# Patient Record
Sex: Male | Born: 1975 | Race: Black or African American | Hispanic: No | Marital: Single | Smoking: Never smoker
Health system: Southern US, Community
[De-identification: ages and names within clinical notes are randomized; demographics above are authoritative.]

## PROBLEM LIST (undated history)

## (undated) DIAGNOSIS — C20 Malignant neoplasm of rectum: Secondary | ICD-10-CM

## (undated) DIAGNOSIS — K219 Gastro-esophageal reflux disease without esophagitis: Secondary | ICD-10-CM

## (undated) DIAGNOSIS — C7800 Secondary malignant neoplasm of unspecified lung: Secondary | ICD-10-CM

## (undated) DIAGNOSIS — I10 Essential (primary) hypertension: Secondary | ICD-10-CM

---

## 2014-01-30 ENCOUNTER — Inpatient Hospital Stay (HOSPITAL_COMMUNITY): Payer: Self-pay

## 2014-01-30 ENCOUNTER — Emergency Department (HOSPITAL_COMMUNITY): Payer: Self-pay

## 2014-01-30 ENCOUNTER — Emergency Department (INDEPENDENT_AMBULATORY_CARE_PROVIDER_SITE_OTHER)
Admission: EM | Admit: 2014-01-30 | Discharge: 2014-01-30 | Disposition: A | Discharge status: Nursing Home (Includes ICF) | Payer: Self-pay | Source: Home / Self Care | Attending: Family Medicine | Admitting: Family Medicine

## 2014-01-30 ENCOUNTER — Inpatient Hospital Stay (HOSPITAL_COMMUNITY)
Admission: EM | Admit: 2014-01-30 | Discharge: 2014-02-02 | DRG: 375 | Disposition: A | Discharge status: Hospice | Payer: Self-pay | Attending: Internal Medicine | Admitting: Internal Medicine

## 2014-01-30 ENCOUNTER — Encounter (HOSPITAL_COMMUNITY): Payer: Self-pay | Admitting: Emergency Medicine

## 2014-01-30 DIAGNOSIS — R1900 Intra-abdominal and pelvic swelling, mass and lump, unspecified site: Secondary | ICD-10-CM

## 2014-01-30 DIAGNOSIS — R160 Hepatomegaly, not elsewhere classified: Secondary | ICD-10-CM

## 2014-01-30 DIAGNOSIS — C787 Secondary malignant neoplasm of liver and intrahepatic bile duct: Secondary | ICD-10-CM | POA: Diagnosis present

## 2014-01-30 DIAGNOSIS — C20 Malignant neoplasm of rectum: Principal | ICD-10-CM | POA: Diagnosis present

## 2014-01-30 DIAGNOSIS — K5900 Constipation, unspecified: Secondary | ICD-10-CM | POA: Diagnosis present

## 2014-01-30 DIAGNOSIS — R109 Unspecified abdominal pain: Secondary | ICD-10-CM

## 2014-01-30 DIAGNOSIS — K921 Melena: Secondary | ICD-10-CM

## 2014-01-30 DIAGNOSIS — R634 Abnormal weight loss: Secondary | ICD-10-CM | POA: Diagnosis present

## 2014-01-30 DIAGNOSIS — C7800 Secondary malignant neoplasm of unspecified lung: Secondary | ICD-10-CM | POA: Diagnosis present

## 2014-01-30 HISTORY — DX: Secondary malignant neoplasm of unspecified lung: C78.00

## 2014-01-30 HISTORY — PX: LIVER BIOPSY: SHX301

## 2014-01-30 HISTORY — DX: Essential (primary) hypertension: I10

## 2014-01-30 HISTORY — DX: Gastro-esophageal reflux disease without esophagitis: K21.9

## 2014-01-30 HISTORY — DX: Malignant neoplasm of rectum: C20

## 2014-01-30 LAB — URINALYSIS, ROUTINE W REFLEX MICROSCOPIC
Bilirubin Urine: NEGATIVE
Glucose, UA: NEGATIVE mg/dL
Hgb urine dipstick: NEGATIVE
Ketones, ur: NEGATIVE mg/dL
Leukocytes, UA: NEGATIVE
NITRITE: NEGATIVE
PROTEIN: NEGATIVE mg/dL
SPECIFIC GRAVITY, URINE: 1.021 (ref 1.005–1.030)
Urobilinogen, UA: 0.2 mg/dL (ref 0.0–1.0)
pH: 5 (ref 5.0–8.0)

## 2014-01-30 LAB — COMPREHENSIVE METABOLIC PANEL
ALT: 83 U/L — ABNORMAL HIGH (ref 0–53)
AST: 95 U/L — ABNORMAL HIGH (ref 0–37)
Albumin: 3.4 g/dL — ABNORMAL LOW (ref 3.5–5.2)
Alkaline Phosphatase: 313 U/L — ABNORMAL HIGH (ref 39–117)
BUN: 15 mg/dL (ref 6–23)
CO2: 26 mEq/L (ref 19–32)
CREATININE: 1.04 mg/dL (ref 0.50–1.35)
Calcium: 9.4 mg/dL (ref 8.4–10.5)
Chloride: 102 mEq/L (ref 96–112)
Glucose, Bld: 95 mg/dL (ref 70–99)
Potassium: 4 mEq/L (ref 3.7–5.3)
Sodium: 144 mEq/L (ref 137–147)
TOTAL PROTEIN: 7.8 g/dL (ref 6.0–8.3)
Total Bilirubin: 1.5 mg/dL — ABNORMAL HIGH (ref 0.3–1.2)

## 2014-01-30 LAB — POCT URINALYSIS DIP (DEVICE)
Bilirubin Urine: NEGATIVE
Glucose, UA: NEGATIVE mg/dL
Ketones, ur: NEGATIVE mg/dL
Leukocytes, UA: NEGATIVE
NITRITE: NEGATIVE
PH: 5.5 (ref 5.0–8.0)
Protein, ur: 30 mg/dL — AB
Specific Gravity, Urine: 1.015 (ref 1.005–1.030)
Urobilinogen, UA: 0.2 mg/dL (ref 0.0–1.0)

## 2014-01-30 LAB — IRON AND TIBC
IRON: 17 ug/dL — AB (ref 42–135)
Saturation Ratios: 6 % — ABNORMAL LOW (ref 20–55)
TIBC: 273 ug/dL (ref 215–435)
UIBC: 256 ug/dL (ref 125–400)

## 2014-01-30 LAB — POC OCCULT BLOOD, ED: FECAL OCCULT BLD: NEGATIVE

## 2014-01-30 LAB — FERRITIN: FERRITIN: 283 ng/mL (ref 22–322)

## 2014-01-30 LAB — PROTIME-INR
INR: 1.06 (ref 0.00–1.49)
PROTHROMBIN TIME: 13.6 s (ref 11.6–15.2)

## 2014-01-30 LAB — VITAMIN B12: Vitamin B-12: >2000 pg/mL — ABNORMAL HIGH (ref 211–911)

## 2014-01-30 LAB — FOLATE: Folate: 13 ng/mL

## 2014-01-30 LAB — CBC
HCT: 29.3 % — ABNORMAL LOW (ref 39.0–52.0)
HEMOGLOBIN: 9.8 g/dL — AB (ref 13.0–17.0)
MCH: 27.6 pg (ref 26.0–34.0)
MCHC: 33.4 g/dL (ref 30.0–36.0)
MCV: 82.5 fL (ref 78.0–100.0)
Platelets: 305 10*3/uL (ref 150–400)
RBC: 3.55 MIL/uL — ABNORMAL LOW (ref 4.22–5.81)
RDW: 13.1 % (ref 11.5–15.5)
WBC: 6.5 10*3/uL (ref 4.0–10.5)

## 2014-01-30 LAB — ABO/RH: ABO/RH(D): A POS

## 2014-01-30 LAB — RETICULOCYTES
RBC.: 3.35 MIL/uL — ABNORMAL LOW (ref 4.22–5.81)
RETIC COUNT ABSOLUTE: 40.2 10*3/uL (ref 19.0–186.0)
RETIC CT PCT: 1.2 % (ref 0.4–3.1)

## 2014-01-30 LAB — LIPASE, BLOOD: LIPASE: 26 U/L (ref 11–59)

## 2014-01-30 MED ORDER — HYDROCODONE-ACETAMINOPHEN 5-325 MG PO TABS
1.0000 | ORAL_TABLET | ORAL | Status: DC | PRN
  Administered 2014-01-31: 2 via ORAL
  Filled 2014-01-30: qty 2

## 2014-01-30 MED ORDER — FENTANYL CITRATE 0.05 MG/ML IJ SOLN
INTRAMUSCULAR | Status: AC
  Filled 2014-01-30: qty 4

## 2014-01-30 MED ORDER — GUAIFENESIN-DM 100-10 MG/5ML PO SYRP: 5.0000 mL | ORAL_SOLUTION | ORAL | Status: DC | PRN

## 2014-01-30 MED ORDER — POLYETHYLENE GLYCOL 3350 17 G PO PACK
17.0000 g | PACK | Freq: Every day | ORAL | Status: DC | PRN
  Administered 2014-02-01: 17 g via ORAL
  Filled 2014-01-30: qty 1

## 2014-01-30 MED ORDER — FAMOTIDINE 10 MG PO TABS
10.0000 mg | ORAL_TABLET | Freq: Every day | ORAL | Status: DC
  Administered 2014-01-31 – 2014-02-01 (×2): 10 mg via ORAL
  Filled 2014-01-30 (×4): qty 1

## 2014-01-30 MED ORDER — IOHEXOL 300 MG/ML  SOLN
25.0000 mL | INTRAMUSCULAR | Status: AC
  Administered 2014-01-30: 25 mL via ORAL

## 2014-01-30 MED ORDER — MORPHINE SULFATE 4 MG/ML IJ SOLN
4.0000 mg | INTRAMUSCULAR | Status: DC | PRN
  Administered 2014-01-30: 4 mg via INTRAVENOUS
  Filled 2014-01-30: qty 1

## 2014-01-30 MED ORDER — IOHEXOL 300 MG/ML  SOLN
100.0000 mL | Freq: Once | INTRAMUSCULAR | Status: AC | PRN
  Administered 2014-01-30: 100 mL via INTRAVENOUS

## 2014-01-30 MED ORDER — DOCUSATE SODIUM 100 MG PO CAPS
200.0000 mg | ORAL_CAPSULE | Freq: Two times a day (BID) | ORAL | Status: DC
  Administered 2014-01-30 – 2014-02-02 (×6): 200 mg via ORAL
  Filled 2014-01-30 (×6): qty 2

## 2014-01-30 MED ORDER — METOPROLOL TARTRATE 1 MG/ML IV SOLN: 5.0000 mg | INTRAVENOUS | Status: DC | PRN

## 2014-01-30 MED ORDER — MIDAZOLAM HCL 2 MG/2ML IJ SOLN
INTRAMUSCULAR | Status: AC
  Filled 2014-01-30: qty 4

## 2014-01-30 MED ORDER — FENTANYL CITRATE 0.05 MG/ML IJ SOLN
INTRAMUSCULAR | Status: AC | PRN
  Administered 2014-01-30 (×2): 50 ug via INTRAVENOUS

## 2014-01-30 MED ORDER — ONDANSETRON HCL 4 MG PO TABS
4.0000 mg | ORAL_TABLET | Freq: Four times a day (QID) | ORAL | Status: DC | PRN
  Administered 2014-01-31: 4 mg via ORAL
  Filled 2014-01-30: qty 1

## 2014-01-30 MED ORDER — ONDANSETRON HCL 4 MG/2ML IJ SOLN
4.0000 mg | Freq: Four times a day (QID) | INTRAMUSCULAR | Status: DC | PRN
  Administered 2014-01-30: 4 mg via INTRAVENOUS
  Filled 2014-01-30: qty 2

## 2014-01-30 MED ORDER — GELATIN ABSORBABLE 12-7 MM EX MISC
CUTANEOUS | Status: AC
  Filled 2014-01-30: qty 1

## 2014-01-30 MED ORDER — MIDAZOLAM HCL 2 MG/2ML IJ SOLN
INTRAMUSCULAR | Status: AC | PRN
  Administered 2014-01-30: 2 mg via INTRAVENOUS
  Administered 2014-01-30: 1 mg via INTRAVENOUS

## 2014-01-30 NOTE — ED Notes (Signed)
Dr. Walden at bedside 

## 2014-01-30 NOTE — ED Notes (Signed)
Patient returned from CT

## 2014-01-30 NOTE — ED Notes (Signed)
C/o blood in stool x 2 months. Hx of hemorrhoids.  Recently released from prison 2 days ago/10 yr sentence.   States epigastric pain radiates down center of abdomen. Fullness, only able to eat a hand full of food.  Having to push to make urine.  N/v.    No otc meds taken for symptoms.   Denies black / coffee ground stool.

## 2014-01-30 NOTE — ED Notes (Signed)
Patient finished with oral contrast, CT notified

## 2014-01-30 NOTE — H&P (Signed)
Chief Complaint: "Abdominal pain, night sweats, weight loss, blood in my stool." Referring Physician: Dr. Candiss Norse HPI: Andrew Gomez is an 38 y.o. male who presents today c/o RUQ/epigastric abdominal pain x 6 months, bloated, 10-15 lb weight loss in 2 months, night sweats, 1-2 months of dark blood in his stool. He denies any chest pain, shortness of breath or palpitations. He denies any active signs of bleeding or excessive bruising. He admits to fevers and chills. The patient denies any history of sleep apnea or chronic oxygen use. He denies any known complication to sedation. CT performed today reveals infiltrating rectal cancer with perirectal adenopathy, diffuse hepatic metastatic disease and pulmonary metastatic disease. IR received request for image guided liver lesion biopsy. He denies any known family history of cancer.    Past Medical History:  Past Medical History  Diagnosis Date  . Hypertension   . 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 No pertinent past surgical history.  Family History: No family history on file.  Social History:  reports that he has never smoked. He does not have any smokeless tobacco history on file. He reports that he does not drink alcohol or use illicit drugs.  Allergies: No Known Allergies  Medications:   Medication List    ASK your doctor about these medications       dicyclomine 10 MG capsule  Commonly known as:  BENTYL  Take 10 mg by mouth 3 (three) times daily before meals.     docusate sodium 100 MG capsule  Commonly known as:  COLACE  Take 100 mg by mouth 2 (two) times daily.     ranitidine 150 MG tablet  Commonly known as:  ZANTAC  Take 150 mg by mouth 2 (two) times daily.       Please HPI for pertinent positives, otherwise complete 10 system ROS negative.  Physical Exam: BP 144/97  Pulse 106  Temp(Src) 100.2 F (37.9 C) (Oral)  Resp 18  SpO2 100% There is no height or  weight on file to calculate BMI.  General Appearance:  Alert, cooperative, no distress  Head:  Normocephalic, without obvious abnormality, atraumatic  Neck: Supple, symmetrical, trachea midline  Lungs:   Clear to auscultation bilaterally, no w/r/r, respirations unlabored without use of accessory muscles.  Chest Wall:  No tenderness or deformity  Heart:  Regular rate and rhythm, S1, S2 normal, no murmur, rub or gallop.  Abdomen:   Soft, epigastric tenderness, non distended, (+) BS  Extremities: Extremities normal, atraumatic, no cyanosis or edema  Pulses: 2+ and symmetric  Neurologic: Normal affect, no gross deficits.   Results for orders placed during the hospital encounter of 01/30/14 (from the past 48 hour(s))  CBC     Status: Abnormal   Collection Time    01/30/14 10:44 AM      Result Value Ref Range   WBC 6.5  4.0 - 10.5 K/uL   RBC 3.55 (*) 4.22 - 5.81 MIL/uL   Hemoglobin 9.8 (*) 13.0 - 17.0 g/dL   HCT 29.3 (*) 39.0 - 52.0 %   MCV 82.5  78.0 - 100.0 fL   MCH 27.6  26.0 - 34.0 pg   MCHC 33.4  30.0 - 36.0 g/dL   RDW 13.1  11.5 - 15.5 %   Platelets 305  150 - 400 K/uL  COMPREHENSIVE METABOLIC PANEL     Status: Abnormal   Collection Time    01/30/14 10:44 AM  Result Value Ref Range   Sodium 144  137 - 147 mEq/L   Potassium 4.0  3.7 - 5.3 mEq/L   Chloride 102  96 - 112 mEq/L   CO2 26  19 - 32 mEq/L   Glucose, Bld 95  70 - 99 mg/dL   BUN 15  6 - 23 mg/dL   Creatinine, Ser 1.04  0.50 - 1.35 mg/dL   Calcium 9.4  8.4 - 10.5 mg/dL   Total Protein 7.8  6.0 - 8.3 g/dL   Albumin 3.4 (*) 3.5 - 5.2 g/dL   AST 95 (*) 0 - 37 U/L   ALT 83 (*) 0 - 53 U/L   Alkaline Phosphatase 313 (*) 39 - 117 U/L   Total Bilirubin 1.5 (*) 0.3 - 1.2 mg/dL   GFR calc non Af Amer >90  >90 mL/min   GFR calc Af Amer >90  >90 mL/min   Comment: (NOTE)     The eGFR has been calculated using the CKD EPI equation.     This calculation has not been validated in all clinical situations.     eGFR's  persistently <90 mL/min signify possible Chronic Kidney     Disease.  LIPASE, BLOOD     Status: None   Collection Time    01/30/14 10:44 AM      Result Value Ref Range   Lipase 26  11 - 59 U/L  POC OCCULT BLOOD, ED     Status: None   Collection Time    01/30/14 11:03 AM      Result Value Ref Range   Fecal Occult Bld NEGATIVE  NEGATIVE  URINALYSIS, ROUTINE W REFLEX MICROSCOPIC     Status: Abnormal   Collection Time    01/30/14 11:45 AM      Result Value Ref Range   Color, Urine AMBER (*) YELLOW   Comment: BIOCHEMICALS MAY BE AFFECTED BY COLOR   APPearance HAZY (*) CLEAR   Specific Gravity, Urine 1.021  1.005 - 1.030   pH 5.0  5.0 - 8.0   Glucose, UA NEGATIVE  NEGATIVE mg/dL   Hgb urine dipstick NEGATIVE  NEGATIVE   Bilirubin Urine NEGATIVE  NEGATIVE   Ketones, ur NEGATIVE  NEGATIVE mg/dL   Protein, ur NEGATIVE  NEGATIVE mg/dL   Urobilinogen, UA 0.2  0.0 - 1.0 mg/dL   Nitrite NEGATIVE  NEGATIVE   Leukocytes, UA NEGATIVE  NEGATIVE   Comment: MICROSCOPIC NOT DONE ON URINES WITH NEGATIVE PROTEIN, BLOOD, LEUKOCYTES, NITRITE, OR GLUCOSE <1000 mg/dL.  PROTIME-INR     Status: None   Collection Time    01/30/14  1:06 PM      Result Value Ref Range   Prothrombin Time 13.6  11.6 - 15.2 seconds   INR 1.06  0.00 - 1.49   Dg Chest 2 View  01/30/2014   CLINICAL DATA:  Abdominal pain  EXAM: CHEST - 2 VIEW  COMPARISON:  None available  FINDINGS: Lungs are clear. Heart size and mediastinal contours are within normal limits. No effusion. Visualized skeletal structures are unremarkable.  IMPRESSION: No acute cardiopulmonary disease.   Electronically Signed   By: Arne Cleveland M.D.   On: 01/30/2014 11:36   Ct Abdomen Pelvis W Contrast  01/30/2014   CLINICAL DATA:  Blood in stools and diffuse abdominal pain.  EXAM: CT ABDOMEN AND PELVIS WITH CONTRAST  TECHNIQUE: Multidetector CT imaging of the abdomen and pelvis was performed using the standard protocol following bolus administration of intravenous  contrast.  CONTRAST:  152m OMNIPAQUE IOHEXOL 300 MG/ML  SOLN  COMPARISON:  None.  FINDINGS: The lung bases demonstrate small bilateral pulmonary nodules suspicious for metastatic disease. The heart is normal in size. No pericardial effusion.  The liver is markedly enlarged and diffusely infiltrated with tumor. There are head numerous lesions and both lobes of liver consistent with metastatic disease. The largest lesion in the left lobe on image number 12 measures 11 x 10 cm. An indexed lesion in the right lobe on image number 33 measures 7.4 x 6.7 cm. The gallbladder is not well visualized and likely compressed. No common bile duct dilatation. The pancreas appears normal. The spleen is normal in size. No focal lesions. The portal and splenic veins are patent. The adrenal glands and kidneys are unremarkable. There is mild compression of the right kidney by the enlarged right hepatic lobe.  The stomach, duodenum and small bowel are unremarkable. No mass lesions or obstructive findings. The colon demonstrates moderate stool. There is irregular and ill-defined wall thickening involving the upper rectum near the rectosigmoid junction suspicious for infiltrating and likely constricting tumor. There is also significant perirectal interstitial change and adenopathy. No enlarged retroperitoneal lymph nodes and no findings for omental or peritoneal surface disease. No inguinal adenopathy.  The bony structures are unremarkable.  IMPRESSION: Infiltrating rectal cancer with perirectal adenopathy, diffuse hepatic metastatic disease and pulmonary metastatic disease.   Electronically Signed   By: MKalman JewelsM.D.   On: 01/30/2014 12:46    Assessment/Plan Hepatic lesions, request for image guided liver lesion biopsy. CT consistent with infiltrating rectal cancer diffuse hepatic metastatic disease and pulmonary metastatic disease. Melena, fecal occult negative. Patient has been NPO, labs and images reviewed, no blood  thinners. Risks and Benefits discussed with the patient. All of the patient's questions were answered, patient is agreeable to proceed. Consent signed and in chart.   MTsosie BillingD PA-C 01/30/2014, 3:18 PM

## 2014-01-30 NOTE — ED Provider Notes (Signed)
Medical screening examination/treatment/procedure(s) were performed by resident physician or non-physician practitioner and as supervising physician I was immediately available for consultation/collaboration.   Pauline Good MD.   Billy Fischer, MD 01/30/14 1059

## 2014-01-30 NOTE — Procedures (Signed)
Successful LT LIVER MASS CORE BXS WITH ULTRASOUND NO COMP STABLE PATH PENDING FULL REPORT IN PACS

## 2014-01-30 NOTE — ED Notes (Signed)
GI at bedside

## 2014-01-30 NOTE — ED Provider Notes (Signed)
CSN: [id number]     Arrival date & time 01/30/14  3976 History   First MD Initiated Contact with Patient 01/30/14 [phone]     Chief Complaint  Patient presents with  . Rectal Bleeding   (Consider location/radiation/quality/duration/timing/severity/associated sxs/prior Treatment) HPI Comments: 38 year old male, recently released from 45 year prison sentence 2 days ago, presents for evaluation of an ongoing problem for the past 2-3 months. He did not address this in prison because it would have delayed his release. For about 3 months, he has had daily bloody bowel movements.  At times, his BMs are all just a large quantity of blood without any stool at all.  The blood is mixed in with the stool.  For about 2 months, he has had progressively worsening pain in the epigastrum.  This started just below the xyphoid process and the pain has crept down toward the umbilicus over the past 2 months.  He has a painful mass in the upper abdomen that has been growing as well.  He has also had early satiety for the past 2 months.  Additionally, for the past 2 months he has to strain very hard to urinate and his urine is very dark.  For the past 2-3 weeks, he has had fatigue, night sweats, and an unintentional 10 lb weight loss.  He also admits to associated SOB and cough.    Patient is a 38 y.o. male presenting with hematochezia.  Rectal Bleeding Associated symptoms: abdominal pain and fever   Associated symptoms: no dizziness, no light-headedness and no vomiting     History reviewed. No pertinent past medical history. History reviewed. No pertinent past surgical history. History reviewed. No pertinent family history. History  Substance Use Topics  . Smoking status: Never Smoker   . Smokeless tobacco: Not on file  . Alcohol Use: No    Review of Systems  Constitutional: Positive for fever, diaphoresis, activity change, appetite change, fatigue and unexpected weight change. Negative for chills.  HENT: Negative  for sore throat.   Eyes: Negative for visual disturbance.  Respiratory: Positive for cough and shortness of breath.   Cardiovascular: Negative for chest pain, palpitations and leg swelling.  Gastrointestinal: Positive for nausea, abdominal pain, constipation, blood in stool, hematochezia and abdominal distention. Negative for vomiting and diarrhea.  Genitourinary: Positive for difficulty urinating. Negative for dysuria, urgency, frequency, hematuria and decreased urine volume.  Musculoskeletal: Negative for arthralgias, myalgias, neck pain and neck stiffness.  Skin: Negative for rash.  Neurological: Negative for dizziness, weakness and light-headedness.    Allergies  Review of patient's allergies indicates no known allergies.  Home Medications  No current outpatient prescriptions on file. BP 134/92  Pulse 103  Temp(Src) 98.6 F (37 C) (Oral)  Resp 20  SpO2 99% Physical Exam  Nursing note and vitals reviewed. Constitutional: He is oriented to person, place, and time. He appears well-developed and well-nourished. No distress.  Thin habitus   HENT:  Head: Normocephalic.  Cardiovascular: Regular rhythm and normal heart sounds.  Tachycardia present.   Pulmonary/Chest: Effort normal. No respiratory distress.  Abdominal: Normal appearance and bowel sounds are normal. He exhibits no ascites. There is hepatosplenomegaly (significant hepatomegaly which crosses the midline of the abdomen, tender). There is tenderness in the epigastric area. There is no rigidity, no rebound, no guarding, no tenderness at McBurney's point and negative Murphy's sign.  Neurological: He is alert and oriented to person, place, and time. Coordination normal.  Skin: Skin is warm and dry. No rash noted.  He is not diaphoretic.  Psychiatric: He has a normal mood and affect. Judgment normal.    ED Course  Procedures (including critical care time) Labs Review Labs Reviewed - No data to display Imaging Review No  results found.   MDM   1. Abdominal pain   2. Abdominal mass   3. Hepatomegaly   4. Hematochezia    DDx includes inflammatory bowel disease, malignancy, hepatitis, or other chronic infection.  This pt has no insurance, released from prison 2 days ago, limited social support, living in a halfway house, do not feel comfortable discharging him with outpatient followup at this time, I feel his H&P warrants further eval in the ED.  Transferred via [place], PA-C 01/30/14 1030

## 2014-01-30 NOTE — Progress Notes (Signed)
Admitted to room 6n17 from ED, denies pain/nausea at this time,oriented x4, friend at beside

## 2014-01-30 NOTE — ED Provider Notes (Signed)
CSN: [id number]     Arrival date & time 01/30/14  1033 History   First MD Initiated Contact with Patient 01/30/14 1036     No chief complaint on file.    (Consider location/radiation/quality/duration/timing/severity/associated sxs/prior Treatment) Patient is a 38 y.o. male presenting with abdominal pain. The history is provided by the patient.  Abdominal Pain Pain location:  Epigastric and periumbilical Pain quality: aching   Pain radiates to:  Does not radiate Pain severity:  Moderate Onset quality:  Gradual Duration:  2 weeks Timing:  Constant Progression:  Worsening Chronicity:  New Context: not sick contacts and not trauma   Context comment:  Recent incarceration Relieved by:  Nothing Worsened by:  Nothing tried Associated symptoms: constipation and fever (at night)   Associated symptoms: no chest pain, no cough, no shortness of breath and no vomiting     No past medical history on file. No medical history, no surgical history. No past surgical history on file. No family history on file. History  Substance Use Topics  . Smoking status: Never Smoker   . Smokeless tobacco: Not on file  . Alcohol Use: No  No alcohol or illicit drug use.  Review of Systems  Constitutional: Positive for fever (at night), diaphoresis (night sweats), appetite change (decreased, early satiety) and unexpected weight change (10 lbs in past 2 weeks).  Respiratory: Negative for cough and shortness of breath.   Cardiovascular: Negative for chest pain and leg swelling.  Gastrointestinal: Positive for abdominal pain and constipation. Negative for vomiting.  Genitourinary: Positive for difficulty urinating.  All other systems reviewed and are negative.      Allergies  Review of patient's allergies indicates no known allergies.  Home Medications  No current outpatient prescriptions on file. BP 131/90  Pulse 100  Temp(Src) 98 F (36.7 C) (Oral)  Resp 18  SpO2 99% Physical Exam  Nursing  note and vitals reviewed. Constitutional: He is oriented to person, place, and time. He appears well-developed and well-nourished. No distress.  HENT:  Head: Normocephalic and atraumatic.  Mouth/Throat: No oropharyngeal exudate.  Eyes: EOM are normal. Pupils are equal, round, and reactive to light.  Neck: Normal range of motion. Neck supple.  Cardiovascular: Normal rate and regular rhythm.  Exam reveals no friction rub.   No murmur heard. Pulmonary/Chest: Effort normal and breath sounds normal. No respiratory distress. He has no wheezes. He has no rales.  Abdominal: He exhibits distension and mass (hepatomegaly, liver edge roughly 8 cm below costal margin). There is tenderness (central). There is no rebound. Hernia confirmed negative in the right inguinal area and confirmed negative in the left inguinal area.  Genitourinary: Rectal exam shows no external hemorrhoid, no fissure, no mass and anal tone normal. Guaiac negative stool.    Prostate is not enlarged and not tender. Right testis shows no mass, no swelling and no tenderness. Left testis shows no mass, no swelling and no tenderness.  Musculoskeletal: Normal range of motion. He exhibits no edema.  Lymphadenopathy:       Left: No inguinal adenopathy present.  Neurological: He is alert and oriented to person, place, and time.  Skin: He is not diaphoretic.    ED Course  Procedures (including critical care time) Labs Review Labs Reviewed  CBC - Abnormal; Notable for the following:    RBC 3.55 (*)    Hemoglobin 9.8 (*)    HCT 29.3 (*)    All other components within normal limits  COMPREHENSIVE METABOLIC PANEL  LIPASE, BLOOD  URINALYSIS, ROUTINE W REFLEX MICROSCOPIC   Imaging Review Dg Chest 2 View  01/30/2014   CLINICAL DATA:  Abdominal pain  EXAM: CHEST - 2 VIEW  COMPARISON:  None available  FINDINGS: Lungs are clear. Heart size and mediastinal contours are within normal limits. No effusion. Visualized skeletal structures are  unremarkable.  IMPRESSION: No acute cardiopulmonary disease.   Electronically Signed   By: Arne Cleveland M.D.   On: 01/30/2014 11:36   Ct Abdomen Pelvis W Contrast  01/30/2014   CLINICAL DATA:  Blood in stools and diffuse abdominal pain.  EXAM: CT ABDOMEN AND PELVIS WITH CONTRAST  TECHNIQUE: Multidetector CT imaging of the abdomen and pelvis was performed using the standard protocol following bolus administration of intravenous contrast.  CONTRAST:  154mL OMNIPAQUE IOHEXOL 300 MG/ML  SOLN  COMPARISON:  None.  FINDINGS: The lung bases demonstrate small bilateral pulmonary nodules suspicious for metastatic disease. The heart is normal in size. No pericardial effusion.  The liver is markedly enlarged and diffusely infiltrated with tumor. There are head numerous lesions and both lobes of liver consistent with metastatic disease. The largest lesion in the left lobe on image number 12 measures 11 x 10 cm. An indexed lesion in the right lobe on image number 33 measures 7.4 x 6.7 cm. The gallbladder is not well visualized and likely compressed. No common bile duct dilatation. The pancreas appears normal. The spleen is normal in size. No focal lesions. The portal and splenic veins are patent. The adrenal glands and kidneys are unremarkable. There is mild compression of the right kidney by the enlarged right hepatic lobe.  The stomach, duodenum and small bowel are unremarkable. No mass lesions or obstructive findings. The colon demonstrates moderate stool. There is irregular and ill-defined wall thickening involving the upper rectum near the rectosigmoid junction suspicious for infiltrating and likely constricting tumor. There is also significant perirectal interstitial change and adenopathy. No enlarged retroperitoneal lymph nodes and no findings for omental or peritoneal surface disease. No inguinal adenopathy.  The bony structures are unremarkable.  IMPRESSION: Infiltrating rectal cancer with perirectal adenopathy,  diffuse hepatic metastatic disease and pulmonary metastatic disease.   Electronically Signed   By: Kalman Jewels M.D.   On: 01/30/2014 12:46   Ir US Guide Bx Asp/drain  01/30/2014   CLINICAL DATA:  Numerous liver lesions compatible with metastases, rectal mass, presenting metastatic colorectal cancer  EXAM: ULTRASOUND GUIDED CORE BIOPSY OF LEFT HEPATIC MASS  MEDICATIONS: 3 mg IV Versed; 100 mcg IV Fentanyl  Total Moderate Sedation Time: 15 MIN  PROCEDURE: The procedure, risks, benefits, and alternatives were explained to the patient. Questions regarding the procedure were encouraged and answered. The patient understands and consents to the procedure.  The abdominal midline above the umbilicus was prepped with ChloraPrep in a sterile fashion, and a sterile drape was applied covering the operative field. A sterile gown and sterile gloves were used for the procedure. Local anesthesia was provided with 1% Lidocaine.  Previous imaging reviewed. Preliminary ultrasound performed. Several solid large echogenic masses demonstrated throughout the liver correlating with the CT. An anterior midline approach is noted into a left hepatic lesion traversing normal tissue. Under sterile conditions and local anesthesia, a 17 gauge 6.8 cm access needle was advanced from an anterior midline window into a left hepatic lobe echogenic mass. Needle position confirmed with ultrasound. 18 gauge core biopsies obtained. These were placed in formalin. Needle tract embolized with Gel-Foam. Postprocedure imaging demonstrates no hemorrhage or hematoma. Patient tolerated the  biopsy well.  COMPLICATIONS: No immediate  FINDINGS: Imaging confirms needle placement in a large left hepatic echogenic mass  IMPRESSION: Successful ultrasound large left hepatic mass 18 gauge core biopsy   Electronically Signed   By: Daryll Brod M.D.   On: 01/30/2014 16:02     EKG Interpretation None      MDM   Final diagnoses:  Rectal cancer metastasized to  lung  Weight loss, unintentional    38M here from [facility]gent [facility]. Several weeks of worsening symptoms with fever at night, night sweats, early satiety, difficulty in passing stool and urine, bloody stool, abdominal pain. At Urgent Care, found to have hepatomegaly and central abdominal pain. Here, I agree with hepatomegaly. Central abdominal pain. GU exam without hernia, testicular pain. Rectal exam with no external hemorrhoids, no masses felt. Recent incarceration for 10 years, all symptoms began last few weeks while in prison. Will check labs, scan abdomen.  Abdomen with large liver c/w tumors, likely rectal cancer with metastatic spread. Admitted to medicine.  I have reviewed all labs and imaging and considered them in my medical decision making.     Osvaldo Shipper, MD 01/30/14 [phone]

## 2014-01-30 NOTE — H&P (Signed)
Patient Demographics  Andrew Gomez, is a 38 y.o. male  MRN: [id number]   DOB - 13-Jun-1976  Admit Date - 01/30/2014  Outpatient Primary MD for the patient is No PCP Per Patient   With History of -  Past Medical History  Diagnosis Date  . Hypertension   . 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 No pertinent past surgical history.  in for   Chief Complaint  Patient presents with  . Abdominal Pain  . Rectal Bleeding    reported     HPI  Andrew Gomez  is a 38 y.o. male, with no known medical problems, was recently released from prison after 10 years of in consideration presents with about 6 month history of unintentional weight loss of over 15 pounds, night sweats, early satiety , intermittent blood in stool, abdominal bloating, some difficulty in passing urine at times, presented to the ER where his blood work showed mild liver insufficiency, abdominal CT scan was suggestive of metastatic infiltrated rectal cancer with metastases to liver and lungs, his liver is essentially replaced by metastatic disease. I was called to admit the patient.    Review of Systems    In addition to the HPI above,   No Fever-chills, +ve night sweats No Headache, No changes with Vision or hearing, No problems swallowing food or Liquids, No Chest pain, Cough or Shortness of Breath, No Abdominal pain, No Nausea or Vommitting, constipated +ve Blood in stool , none Urine, No dysuria, No new skin rashes or bruises, No new joints pains-aches,  No new weakness, tingling, numbness in any extremity, genralized weakness No recent weight gain , ++ weight loss, No polyuria, polydypsia or polyphagia, No significant Mental Stressors.  A full 10 point Review of Systems was done, except as  stated above, all other Review of Systems were negative.   Social History History  Substance Use Topics  . Smoking status: Never Smoker   . Smokeless tobacco: Not on file  . Alcohol Use: No      Family History Not known  Prior to Admission medications   Medication Sig Start Date End Date Taking? Authorizing Provider  dicyclomine (BENTYL) 10 MG capsule Take 10 mg by mouth 3 (three) times daily before meals.   Yes Historical Provider, MD  docusate sodium (COLACE) 100 MG capsule Take 100 mg by mouth 2 (two) times daily.   Yes Historical Provider, MD  ranitidine (ZANTAC) 150 MG tablet Take 150 mg by mouth 2 (two) times daily.   Yes Historical Provider, MD    No Known Allergies  Physical Exam  Vitals  Blood pressure 124/82, pulse 110, temperature 98 F (36.7 C), temperature source Oral, resp. rate 15, SpO2 100.00%.   1. General young thin AA male lying in bed in NAD,    2. Normal affect and insight, Not Suicidal or Homicidal, Awake Alert, Oriented X 3.  3. No F.N  deficits, ALL C.Nerves Intact, Strength 5/5 all 4 extremities, Sensation intact all 4 extremities, Plantars down going.  4. Ears and Eyes appear Normal, Conjunctivae clear, PERRLA. Moist Oral Mucosa.  5. Supple Neck, No JVD, No cervical lymphadenopathy appriciated, No Carotid Bruits.  6. Symmetrical Chest wall movement, Good air movement bilaterally, CTAB.  7. RRR, No Gallops, Rubs or Murmurs, No Parasternal Heave.  8. Positive Bowel Sounds, Abdomen Soft but distended, palpable liver, Non tender, No organomegaly appriciated,No rebound -guarding or rigidity.  9.  No Cyanosis, Normal Skin Turgor, No Skin Rash or Bruise.  10. Good muscle tone,  joints appear normal , no effusions, Normal ROM.  11. No Palpable Lymph Nodes in Neck or Axillae     Data Review  CBC  Recent Labs Lab 01/30/14 1044  WBC 6.5  HGB 9.8*  HCT 29.3*  PLT 305  MCV 82.5  MCH 27.6  MCHC 33.4  RDW 13.1    ------------------------------------------------------------------------------------------------------------------  Chemistries   Recent Labs Lab 01/30/14 1044  NA 144  K 4.0  CL 102  CO2 26  GLUCOSE 95  BUN 15  CREATININE 1.04  CALCIUM 9.4  AST 95*  ALT 83*  ALKPHOS 313*  BILITOT 1.5*   ------------------------------------------------------------------------------------------------------------------ CrCl is unknown because there is no height on file for the current visit. ------------------------------------------------------------------------------------------------------------------ No results found for this basename: TSH, T4TOTAL, FREET3, T3FREE, THYROIDAB,  in the last 72 hours   Coagulation profile No results found for this basename: INR, PROTIME,  in the last 168 hours ------------------------------------------------------------------------------------------------------------------- No results found for this basename: DDIMER,  in the last 72 hours -------------------------------------------------------------------------------------------------------------------  Cardiac Enzymes No results found for this basename: CK, CKMB, TROPONINI, MYOGLOBIN,  in the last 168 hours ------------------------------------------------------------------------------------------------------------------ No components found with this basename: POCBNP,    ---------------------------------------------------------------------------------------------------------------  Urinalysis    Component Value Date/Time   COLORURINE AMBER* 01/30/2014 1145   APPEARANCEUR HAZY* 01/30/2014 1145   LABSPEC 1.021 01/30/2014 1145   PHURINE 5.0 01/30/2014 1145   GLUCOSEU NEGATIVE 01/30/2014 1145   HGBUR NEGATIVE 01/30/2014 [place] 01/30/2014 [place] 01/30/2014 1145   PROTEINUR NEGATIVE 01/30/2014 1145   UROBILINOGEN 0.2 01/30/2014 1145   NITRITE NEGATIVE 01/30/2014 1145   LEUKOCYTESUR  NEGATIVE 01/30/2014 1145    ----------------------------------------------------------------------------------------------------------------  Imaging results:   Dg Chest 2 View  01/30/2014   CLINICAL DATA:  Abdominal pain  EXAM: CHEST - 2 VIEW  COMPARISON:  None available  FINDINGS: Lungs are clear. Heart size and mediastinal contours are within normal limits. No effusion. Visualized skeletal structures are unremarkable.  IMPRESSION: No acute cardiopulmonary disease.   Electronically Signed   By: Arne Cleveland M.D.   On: 01/30/2014 11:36   Ct Abdomen Pelvis W Contrast  01/30/2014   CLINICAL DATA:  Blood in stools and diffuse abdominal pain.  EXAM: CT ABDOMEN AND PELVIS WITH CONTRAST  TECHNIQUE: Multidetector CT imaging of the abdomen and pelvis was performed using the standard protocol following bolus administration of intravenous contrast.  CONTRAST:  175mL OMNIPAQUE IOHEXOL 300 MG/ML  SOLN  COMPARISON:  None.  FINDINGS: The lung bases demonstrate small bilateral pulmonary nodules suspicious for metastatic disease. The heart is normal in size. No pericardial effusion.  The liver is markedly enlarged and diffusely infiltrated with tumor. There are head numerous lesions and both lobes of liver consistent with metastatic disease. The largest lesion in the left lobe on image number 12 measures 11 x 10 cm. An indexed lesion in the right  lobe on image number 33 measures 7.4 x 6.7 cm. The gallbladder is not well visualized and likely compressed. No common bile duct dilatation. The pancreas appears normal. The spleen is normal in size. No focal lesions. The portal and splenic veins are patent. The adrenal glands and kidneys are unremarkable. There is mild compression of the right kidney by the enlarged right hepatic lobe.  The stomach, duodenum and small bowel are unremarkable. No mass lesions or obstructive findings. The colon demonstrates moderate stool. There is irregular and ill-defined wall thickening  involving the upper rectum near the rectosigmoid junction suspicious for infiltrating and likely constricting tumor. There is also significant perirectal interstitial change and adenopathy. No enlarged retroperitoneal lymph nodes and no findings for omental or peritoneal surface disease. No inguinal adenopathy.  The bony structures are unremarkable.  IMPRESSION: Infiltrating rectal cancer with perirectal adenopathy, diffuse hepatic metastatic disease and pulmonary metastatic disease.   Electronically Signed   By: Kalman Jewels M.D.   On: 01/30/2014 12:46         Assessment & Plan   1. New diagnosis of Infiltrating rectal cancer with metastases to liver and lungs. I have discussed his case both with GI and interventional radiology, he is going for a liver biopsy by interventional radiology later today, will check coags, with the burden of his disease at most he would be a candidate for palliative treatment, once diagnosis is confirmed will involve palliative care and if needed oncology in radiation oncology.    2. Constipation with difficulty in passing stool or urine. We'll place him on stool softeners, bowel regimen, and monitor.     DVT Prophylaxis   SCDs    AM Labs Ordered, also please review Full Orders  Family Communication: Admission, patients condition and plan of care including tests being ordered have been discussed with the patient and wife who indicate understanding and agree with the plan and Code Status.  Code Status Full  Likely DC to  Home/Hospice  Condition GUARDED     Time spent in minutes : 35    SINGH,PRASHANT K M.D on 01/30/2014 at 1:25 PM  Between 7am to 7pm - Pager - [phone]  After 7pm go to [email or web address] - password [email or web address]  And look for the night coverage person covering me after hours  [facility] Office  [phone]

## 2014-01-30 NOTE — Progress Notes (Signed)
CSW spoke with pt and was informed by pt that was diagnosed with cancer. CSW asked if they would like to have a Chaplain to visit. Pt declined. Pt very tearful during conversation. CSW was able to give pt and girlfriend, Olivia Mackie resources for the [employer] application, Social Security Disability and Glass blower/designer. Pt/family was appreciative of services. Pt shared that he will be admitted for further testing. CSW shared that if he needs further assistance ask for CSW on the floor.   [address], [place]

## 2014-01-30 NOTE — ED Notes (Signed)
Patient reports night sweats/fever, blood in stool, hard stools, abdominal pain/distention, bloating, fatigue, decreased appetite and weight loss around 10 lbs the past 2 months, reports increased shortness of breath and dizzy with activity.  States he has increased difficulty urinating and he has to sit and strain to urinate

## 2014-01-30 NOTE — ED Notes (Addendum)
Pt states he feels like he has painful lumps in abdomen for 6 months, feeling bloated, eating habits changed, blood in stool, reports increased fatigue and sob.  Just released from 2 days ago from prison, 10 year sentence.  States that his urine is dark amber and has to sit and strain to urinate.

## 2014-01-31 ENCOUNTER — Inpatient Hospital Stay (HOSPITAL_COMMUNITY): Payer: Self-pay

## 2014-01-31 DIAGNOSIS — K219 Gastro-esophageal reflux disease without esophagitis: Secondary | ICD-10-CM

## 2014-01-31 DIAGNOSIS — R143 Flatulence: Secondary | ICD-10-CM

## 2014-01-31 DIAGNOSIS — C787 Secondary malignant neoplasm of liver and intrahepatic bile duct: Secondary | ICD-10-CM

## 2014-01-31 DIAGNOSIS — R141 Gas pain: Secondary | ICD-10-CM

## 2014-01-31 DIAGNOSIS — R142 Eructation: Secondary | ICD-10-CM

## 2014-01-31 LAB — BASIC METABOLIC PANEL
BUN: 15 mg/dL (ref 6–23)
CHLORIDE: 97 meq/L (ref 96–112)
CO2: 24 mEq/L (ref 19–32)
Calcium: 9.7 mg/dL (ref 8.4–10.5)
Creatinine, Ser: 1.06 mg/dL (ref 0.50–1.35)
GFR calc Af Amer: >90 mL/min (ref >90)
GFR calc non Af Amer: 89 mL/min — ABNORMAL LOW (ref >90)
GLUCOSE: 96 mg/dL (ref 70–99)
POTASSIUM: 4.3 meq/L (ref 3.7–5.3)
Sodium: 140 mEq/L (ref 137–147)

## 2014-01-31 LAB — URINE MICROSCOPIC-ADD ON

## 2014-01-31 LAB — URINALYSIS, ROUTINE W REFLEX MICROSCOPIC
Glucose, UA: NEGATIVE mg/dL
HGB URINE DIPSTICK: NEGATIVE
Ketones, ur: 15 mg/dL — AB
Nitrite: NEGATIVE
PROTEIN: 30 mg/dL — AB
Specific Gravity, Urine: 1.031 — ABNORMAL HIGH (ref 1.005–1.030)
Urobilinogen, UA: 1 mg/dL (ref 0.0–1.0)
pH: 5 (ref 5.0–8.0)

## 2014-01-31 LAB — CBC
HEMATOCRIT: 29.8 % — AB (ref 39.0–52.0)
HEMOGLOBIN: 9.8 g/dL — AB (ref 13.0–17.0)
MCH: 27.2 pg (ref 26.0–34.0)
MCHC: 32.9 g/dL (ref 30.0–36.0)
MCV: 82.8 fL (ref 78.0–100.0)
Platelets: 321 10*3/uL (ref 150–400)
RBC: 3.6 MIL/uL — ABNORMAL LOW (ref 4.22–5.81)
RDW: 13.3 % (ref 11.5–15.5)
WBC: 8.1 10*3/uL (ref 4.0–10.5)

## 2014-01-31 MED ORDER — METOCLOPRAMIDE HCL 5 MG PO TABS
5.0000 mg | ORAL_TABLET | Freq: Three times a day (TID) | ORAL | Status: DC
  Administered 2014-01-31 – 2014-02-02 (×5): 5 mg via ORAL
  Filled 2014-01-31 (×9): qty 1

## 2014-01-31 MED ORDER — PANTOPRAZOLE SODIUM 40 MG PO TBEC
40.0000 mg | DELAYED_RELEASE_TABLET | Freq: Every day | ORAL | Status: DC
  Administered 2014-01-31 – 2014-02-02 (×3): 40 mg via ORAL
  Filled 2014-01-31 (×3): qty 1

## 2014-01-31 MED ORDER — TRAMADOL HCL 50 MG PO TABS
50.0000 mg | ORAL_TABLET | Freq: Four times a day (QID) | ORAL | Status: DC | PRN
  Administered 2014-01-31 – 2014-02-01 (×2): 50 mg via ORAL
  Filled 2014-01-31 (×2): qty 1

## 2014-01-31 MED ORDER — IBUPROFEN 600 MG PO TABS: 600.0000 mg | ORAL_TABLET | Freq: Four times a day (QID) | ORAL | Status: DC | PRN

## 2014-01-31 MED ORDER — ENSURE COMPLETE PO LIQD
237.0000 mL | Freq: Two times a day (BID) | ORAL | Status: DC
  Administered 2014-01-31 – 2014-02-01 (×2): 237 mL via ORAL

## 2014-01-31 MED ORDER — METOPROLOL TARTRATE 25 MG PO TABS
25.0000 mg | ORAL_TABLET | Freq: Two times a day (BID) | ORAL | Status: DC
  Administered 2014-01-31 – 2014-02-01 (×4): 25 mg via ORAL
  Filled 2014-01-31 (×7): qty 1

## 2014-01-31 NOTE — Plan of Care (Signed)
Problem: Phase I Progression Outcomes Goal: Pain controlled with appropriate interventions Outcome: Not Applicable Date Met:  57/90/38 Patient has not voiced any pain today

## 2014-01-31 NOTE — Progress Notes (Signed)
Patient Demographics  Andrew Gomez, is a 38 y.o. male, DOB - 1975-11-21, EXH:[id number]  Admit date - 01/30/2014   Admitting Physician Thurnell Lose, MD  Outpatient Primary MD for the patient is No PCP Per Patient  LOS - 1   Chief Complaint  Patient presents with  . Abdominal Pain  . Rectal Bleeding    reported        Assessment & Plan    1. New diagnosis of Infiltrating rectal cancer with metastases to liver and lungs. I have discussed his case both with GI and interventional radiology, status post CT-guided liver biopsy on 01/31/2014 by IR, GI had nothing to offer, have consulted oncology will see the patient today, prognosis is poor, I offered palliative care to the patient who declined it at this time. Any treatment would be palliative in nature.    2. Constipation with difficulty in passing stool or urine. We'll place him on stool softeners, bowel regimen, had 2 BMs feels better. Passing urine freely now.     3. Monitor temperature. No signs of infection, low-grade temps could be due to underlying malignancy. Will check UA and chest x-ray.      Code Status: Full  Family Communication: Girlfriend bedside  Disposition Plan: Home   Procedures CT-guided liver biopsy on 01/30/2014 by IR, CT abdomen pelvis   Consults  IR, oncology   Medications  Scheduled Meds: . docusate sodium  200 mg Oral BID  . famotidine  10 mg Oral Daily  . metoCLOPramide  5 mg Oral TID AC  . metoprolol tartrate  25 mg Oral BID  . pantoprazole  40 mg Oral Daily   Continuous Infusions:  PRN Meds:.guaiFENesin-dextromethorphan, ibuprofen, metoprolol, ondansetron (ZOFRAN) IV, ondansetron, polyethylene glycol, traMADol  DVT Prophylaxis    SCDs    Lab Results  Component Value Date   PLT 321  01/31/2014    Antibiotics     Anti-infectives   None          Subjective:   Andrew Gomez today has, No headache, No chest pain, No abdominal pain - No Nausea, No new weakness tingling or numbness, No Cough - SOB. Feels better today  Objective:   Filed Vitals:   01/30/14 1835 01/30/14 2240 01/31/14 0442 01/31/14 0552  BP: 135/82 141/90 137/93   Pulse: 111 119 111   Temp: 100 F (37.8 C) 99.9 F (37.7 C) 100.1 F (37.8 C) 99.7 F (37.6 C)  TempSrc: Oral Oral Oral   Resp: 18 18 18    SpO2: 100% 100% 96%     Wt Readings from Last 3 Encounters:  No data found for Wt     Intake/Output Summary (Last 24 hours) at 01/31/14 1206 Last data filed at 01/31/14 0600  Gross per 24 hour  Intake    480 ml  Output      0 ml  Net    480 ml     Physical Exam  Awake Alert, Oriented X 3, No new F.N deficits, Normal affect Merkel.AT,PERRAL Supple Neck,No JVD, No cervical lymphadenopathy appriciated.  Symmetrical Chest wall movement, Good air movement bilaterally, CTAB RRR,No Gallops,Rubs or new Murmurs, No Parasternal Heave +ve B.Sounds, Abd Soft less distended, palpable liver margin, Non tender, No organomegaly appriciated,  No rebound - guarding or rigidity. No Cyanosis, Clubbing or edema, No new Rash or bruise     Data Review   Micro Results No results found for this or any previous visit (from the past 240 hour(s)).  Radiology Reports Dg Chest 2 View  01/30/2014   CLINICAL DATA:  Abdominal pain  EXAM: CHEST - 2 VIEW  COMPARISON:  None available  FINDINGS: Lungs are clear. Heart size and mediastinal contours are within normal limits. No effusion. Visualized skeletal structures are unremarkable.  IMPRESSION: No acute cardiopulmonary disease.   Electronically Signed   By: Arne Cleveland M.D.   On: 01/30/2014 11:36   Ct Abdomen Pelvis W Contrast  01/30/2014   CLINICAL DATA:  Blood in stools and diffuse abdominal pain.  EXAM: CT ABDOMEN AND PELVIS WITH CONTRAST  TECHNIQUE:  Multidetector CT imaging of the abdomen and pelvis was performed using the standard protocol following bolus administration of intravenous contrast.  CONTRAST:  151mL OMNIPAQUE IOHEXOL 300 MG/ML  SOLN  COMPARISON:  None.  FINDINGS: The lung bases demonstrate small bilateral pulmonary nodules suspicious for metastatic disease. The heart is normal in size. No pericardial effusion.  The liver is markedly enlarged and diffusely infiltrated with tumor. There are head numerous lesions and both lobes of liver consistent with metastatic disease. The largest lesion in the left lobe on image number 12 measures 11 x 10 cm. An indexed lesion in the right lobe on image number 33 measures 7.4 x 6.7 cm. The gallbladder is not well visualized and likely compressed. No common bile duct dilatation. The pancreas appears normal. The spleen is normal in size. No focal lesions. The portal and splenic veins are patent. The adrenal glands and kidneys are unremarkable. There is mild compression of the right kidney by the enlarged right hepatic lobe.  The stomach, duodenum and small bowel are unremarkable. No mass lesions or obstructive findings. The colon demonstrates moderate stool. There is irregular and ill-defined wall thickening involving the upper rectum near the rectosigmoid junction suspicious for infiltrating and likely constricting tumor. There is also significant perirectal interstitial change and adenopathy. No enlarged retroperitoneal lymph nodes and no findings for omental or peritoneal surface disease. No inguinal adenopathy.  The bony structures are unremarkable.  IMPRESSION: Infiltrating rectal cancer with perirectal adenopathy, diffuse hepatic metastatic disease and pulmonary metastatic disease.   Electronically Signed   By: Kalman Jewels M.D.   On: 01/30/2014 12:46   Ir US Guide Bx Asp/drain  01/30/2014   CLINICAL DATA:  Numerous liver lesions compatible with metastases, rectal mass, presenting metastatic colorectal  cancer  EXAM: ULTRASOUND GUIDED CORE BIOPSY OF LEFT HEPATIC MASS  MEDICATIONS: 3 mg IV Versed; 100 mcg IV Fentanyl  Total Moderate Sedation Time: 15 MIN  PROCEDURE: The procedure, risks, benefits, and alternatives were explained to the patient. Questions regarding the procedure were encouraged and answered. The patient understands and consents to the procedure.  The abdominal midline above the umbilicus was prepped with ChloraPrep in a sterile fashion, and a sterile drape was applied covering the operative field. A sterile gown and sterile gloves were used for the procedure. Local anesthesia was provided with 1% Lidocaine.  Previous imaging reviewed. Preliminary ultrasound performed. Several solid large echogenic masses demonstrated throughout the liver correlating with the CT. An anterior midline approach is noted into a left hepatic lesion traversing normal tissue. Under sterile conditions and local anesthesia, a 17 gauge 6.8 cm access needle was advanced from an anterior midline window into a left  hepatic lobe echogenic mass. Needle position confirmed with ultrasound. 18 gauge core biopsies obtained. These were placed in formalin. Needle tract embolized with Gel-Foam. Postprocedure imaging demonstrates no hemorrhage or hematoma. Patient tolerated the biopsy well.  COMPLICATIONS: No immediate  FINDINGS: Imaging confirms needle placement in a large left hepatic echogenic mass  IMPRESSION: Successful ultrasound large left hepatic mass 18 gauge core biopsy   Electronically Signed   By: Daryll Brod M.D.   On: 01/30/2014 16:02    CBC  Recent Labs Lab 01/30/14 1044 01/31/14 0418  WBC 6.5 8.1  HGB 9.8* 9.8*  HCT 29.3* 29.8*  PLT 305 321  MCV 82.5 82.8  MCH 27.6 27.2  MCHC 33.4 32.9  RDW 13.1 13.3    Chemistries   Recent Labs Lab 01/30/14 1044 01/31/14 0418  NA 144 140  K 4.0 4.3  CL 102 97  CO2 26 24  GLUCOSE 95 96  BUN 15 15  CREATININE 1.04 1.06  CALCIUM 9.4 9.7  AST 95*  --   ALT  83*  --   ALKPHOS 313*  --   BILITOT 1.5*  --    ------------------------------------------------------------------------------------------------------------------ CrCl is unknown because there is no height on file for the current visit. ------------------------------------------------------------------------------------------------------------------ No results found for this basename: HGBA1C,  in the last 72 hours ------------------------------------------------------------------------------------------------------------------ No results found for this basename: CHOL, HDL, LDLCALC, TRIG, CHOLHDL, LDLDIRECT,  in the last 72 hours ------------------------------------------------------------------------------------------------------------------ No results found for this basename: TSH, T4TOTAL, FREET3, T3FREE, THYROIDAB,  in the last 72 hours ------------------------------------------------------------------------------------------------------------------  Recent Labs  01/30/14 1800  VITAMINB12 >2000*  FOLATE 13.0  FERRITIN 283  TIBC 273  IRON 17*  RETICCTPCT 1.2    Coagulation profile  Recent Labs Lab 01/30/14 1306  INR 1.06    No results found for this basename: DDIMER,  in the last 72 hours  Cardiac Enzymes No results found for this basename: CK, CKMB, TROPONINI, MYOGLOBIN,  in the last 168 hours ------------------------------------------------------------------------------------------------------------------ No components found with this basename: POCBNP,      Time Spent in minutes   35   Tasha Jindra K M.D on 01/31/2014 at 12:06 PM  Between 7am to 7pm - Pager - [phone]  After 7pm go to [email or web address] - password [email or web address]  And look for the night coverage person covering for me after hours  [facility] Office  [phone]

## 2014-01-31 NOTE — Care Management Note (Signed)
CARE MANAGEMENT NOTE 01/31/2014  Patient:  Andrew Gomez, Andrew Gomez   Account Number:  [id number]  Date Initiated:  01/31/2014  Documentation initiated by:  Ricki Miller  Subjective/Objective Assessment:   38 yr old male admitted with night sweats, abdominal pain.     Action/Plan:   Case Manager spoke with patient regarding HCPOA and living will,per order.Patient  given packet.Patient stated he didnt request the information, but would hold onto it.      Anticipated DC Plan:  HOME/SELF CARE      DC Planning Services  CM consult      Choice offered to / List presented to:             Status of service:  Completed, signed off   Discharge Disposition:  [place]

## 2014-01-31 NOTE — Consult Note (Signed)
[place]  Telephone:([phone]) [phone] Fax:(336) [phone]     ID: Andrew Gomez OB: 1976/09/12  MR#: [id number]  GXQ#:[id number]  PCP: No PCP Per Patient GYN:   SU:  OTHER MD:  CHIEF COMPLAINT: "I had bloating and reflux and they found I had cancer."  HISTORY OF PRESENT ILLNESS:  Andrew Gomez tells me he was having bloating and refllux problems for several weeks before developing BRBPR. He brought this to the attention of the institutinal MD at [facility], where the patient was an inmate, and he was treated for hemorrhoids. The patient was recently released to a halfway house here in [place] and he presented to the ED 01/30/2013 with the above complaints. As part of the workup an abd/pelvic CT scan was obtained which showed wall thickening in the upper rectum with perirectal adenopathy, and multiple liver and lung masses consistent with metastatic spread. A liver biopsy was obtained 01/30/2013 with results pending. We were consulted re. Further evaluation and management  REVIEW OF SYSTEMS: The patient has had GERD symptoms, as noted; also some nausea, no vomiting; no taste alteration; no jaundice. Pain is epigastric and minimal. He is having bowel movements, occasionally with blood. Urine stream is good and urine is yellow. No unusual headaches, visual changes or dizzyness. A detailed ROS today was otehrwise noncontributory  PAST MEDICAL HISTORY: Past Medical History  Diagnosis Date  . Hypertension   . 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 GERD (gastroesophageal reflux disease)     PAST SURGICAL HISTORY: Past Surgical History  Procedure Laterality Date  . Liver biopsy  01/30/2014    FAMILY HISTORY History reviewed. No pertinent family history. The patient's mother died age 40 from unclear causes, possibly cancer. She had 10 siblings and per the patient none of them had cancer. The patient's father is living, age 55. The patient has one  brother and one sister. There is no other historyof cancer in the family to his knowledge.  SOCIAL HISTORY:  The patient is single. He has 3 children aged 74, 51 and 8 living with their mothers in 2 separate households in the [place] area. The patient plans to marry Clemetine Marker, who was present at today's visit.     ADVANCED DIRECTIVES: the patient intends to name Ms Felton Clinton as his HCPOA   HEALTH MAINTENANCE: History  Substance Use Topics  . Smoking status: Never Smoker   . Smokeless tobacco: Never Used  . Alcohol Use: No     Colonoscopy:  PSA:  Bone density:  Lipid panel:  No Known Allergies  Current Facility-Administered Medications  Medication Dose Route Frequency Provider Last Rate Last Dose  . docusate sodium (COLACE) capsule 200 mg  200 mg Oral BID Thurnell Lose, MD   200 mg at 01/31/14 0959  . famotidine (PEPCID) tablet 10 mg  10 mg Oral Daily Thurnell Lose, MD   10 mg at 01/31/14 0959  . guaiFENesin-dextromethorphan (ROBITUSSIN DM) 100-10 MG/5ML syrup 5 mL  5 mL Oral Q4H PRN Thurnell Lose, MD      . HYDROcodone-acetaminophen (NORCO/VICODIN) 5-325 MG per tablet 1-2 tablet  1-2 tablet Oral Q4H PRN Thurnell Lose, MD   2 tablet at 01/31/14 0450  . ibuprofen (ADVIL,MOTRIN) tablet 600 mg  600 mg Oral Q6H PRN Thurnell Lose, MD      . metoprolol (LOPRESSOR) injection 5 mg  5 mg Intravenous Q4H PRN Thurnell Lose, MD      . metoprolol tartrate (  LOPRESSOR) tablet 25 mg  25 mg Oral BID Thurnell Lose, MD   25 mg at 01/31/14 0618  . morphine 4 MG/ML injection 4 mg  4 mg Intravenous Q4H PRN Thurnell Lose, MD   4 mg at 01/30/14 2038  . ondansetron (ZOFRAN) tablet 4 mg  4 mg Oral Q6H PRN Thurnell Lose, MD       Or  . ondansetron (ZOFRAN) injection 4 mg  4 mg Intravenous Q6H PRN Thurnell Lose, MD   4 mg at 01/30/14 2038  . polyethylene glycol (MIRALAX / GLYCOLAX) packet 17 g  17 g Oral Daily PRN Thurnell Lose, MD        OBJECTIVE: young African American  man examined sitting at bedside Filed Vitals:   01/31/14 0552  BP:   Pulse:   Temp: 99.7 F (37.6 C)  Resp:      There is no height or weight on file to calculate BMI.    ECOG FS:1 - Symptomatic but completely ambulatory  Ocular: Sclerae unicteric Ear-nose-throat: Oropharynx clear, dentition fair Lymphatic: No cervical or supraclavicular adenopathy Lungs no rales or rhonchi, good excursion bilaterally Heart regular rate and rhythm, no murmur appreciated Abd soft, slightly distended, positive bowel sounds, liver moderately enlarged MSK no focal spinal tenderness, no joint edema Neuro: non-focal, well-oriented, appropriate affect    LAB RESULTS:  CMP     Component Value Date/Time   NA 140 01/31/2014 0418   K 4.3 01/31/2014 0418   CL 97 01/31/2014 0418   CO2 24 01/31/2014 0418   GLUCOSE 96 01/31/2014 0418   BUN 15 01/31/2014 0418   CREATININE 1.06 01/31/2014 0418   CALCIUM 9.7 01/31/2014 0418   PROT 7.8 01/30/2014 1044   ALBUMIN 3.4* 01/30/2014 1044   AST 95* 01/30/2014 1044   ALT 83* 01/30/2014 1044   ALKPHOS 313* 01/30/2014 1044   BILITOT 1.5* 01/30/2014 1044   GFRNONAA 89* 01/31/2014 0418   GFRAA >90 01/31/2014 0418    I No results found for this basename: SPEP, UPEP,  kappa and lambda light chains    Lab Results  Component Value Date   WBC 8.1 01/31/2014   HGB 9.8* 01/31/2014   HCT 29.8* 01/31/2014   MCV 82.8 01/31/2014   PLT 321 01/31/2014    @LASTCHEMISTRY @  No results found for this basename: LABCA2    No components found with this basename: LABCA125     Recent Labs Lab 01/30/14 1306  INR 1.06    Urinalysis    Component Value Date/Time   COLORURINE AMBER* 01/30/2014 1145   APPEARANCEUR HAZY* 01/30/2014 1145   LABSPEC 1.021 01/30/2014 1145   PHURINE 5.0 01/30/2014 1145   GLUCOSEU NEGATIVE 01/30/2014 1145   HGBUR NEGATIVE 01/30/2014 1145   BILIRUBINUR NEGATIVE 01/30/2014 1145   KETONESUR NEGATIVE 01/30/2014 1145   PROTEINUR NEGATIVE 01/30/2014 1145   UROBILINOGEN 0.2 01/30/2014 1145    NITRITE NEGATIVE 01/30/2014 1145   LEUKOCYTESUR NEGATIVE 01/30/2014 1145    STUDIES: Dg Chest 2 View  01/30/2014   CLINICAL DATA:  Abdominal pain  EXAM: CHEST - 2 VIEW  COMPARISON:  None available  FINDINGS: Lungs are clear. Heart size and mediastinal contours are within normal limits. No effusion. Visualized skeletal structures are unremarkable.  IMPRESSION: No acute cardiopulmonary disease.   Electronically Signed   By: Arne Cleveland M.D.   On: 01/30/2014 11:36   Ct Abdomen Pelvis W Contrast  01/30/2014   CLINICAL DATA:  Blood in stools and diffuse abdominal  pain.  EXAM: CT ABDOMEN AND PELVIS WITH CONTRAST  TECHNIQUE: Multidetector CT imaging of the abdomen and pelvis was performed using the standard protocol following bolus administration of intravenous contrast.  CONTRAST:  185mL OMNIPAQUE IOHEXOL 300 MG/ML  SOLN  COMPARISON:  None.  FINDINGS: The lung bases demonstrate small bilateral pulmonary nodules suspicious for metastatic disease. The heart is normal in size. No pericardial effusion.  The liver is markedly enlarged and diffusely infiltrated with tumor. There are head numerous lesions and both lobes of liver consistent with metastatic disease. The largest lesion in the left lobe on image number 12 measures 11 x 10 cm. An indexed lesion in the right lobe on image number 33 measures 7.4 x 6.7 cm. The gallbladder is not well visualized and likely compressed. No common bile duct dilatation. The pancreas appears normal. The spleen is normal in size. No focal lesions. The portal and splenic veins are patent. The adrenal glands and kidneys are unremarkable. There is mild compression of the right kidney by the enlarged right hepatic lobe.  The stomach, duodenum and small bowel are unremarkable. No mass lesions or obstructive findings. The colon demonstrates moderate stool. There is irregular and ill-defined wall thickening involving the upper rectum near the rectosigmoid junction suspicious for infiltrating  and likely constricting tumor. There is also significant perirectal interstitial change and adenopathy. No enlarged retroperitoneal lymph nodes and no findings for omental or peritoneal surface disease. No inguinal adenopathy.  The bony structures are unremarkable.  IMPRESSION: Infiltrating rectal cancer with perirectal adenopathy, diffuse hepatic metastatic disease and pulmonary metastatic disease.   Electronically Signed   By: Kalman Jewels M.D.   On: 01/30/2014 12:46   Ir US Guide Bx Asp/drain  01/30/2014   CLINICAL DATA:  Numerous liver lesions compatible with metastases, rectal mass, presenting metastatic colorectal cancer  EXAM: ULTRASOUND GUIDED CORE BIOPSY OF LEFT HEPATIC MASS  MEDICATIONS: 3 mg IV Versed; 100 mcg IV Fentanyl  Total Moderate Sedation Time: 15 MIN  PROCEDURE: The procedure, risks, benefits, and alternatives were explained to the patient. Questions regarding the procedure were encouraged and answered. The patient understands and consents to the procedure.  The abdominal midline above the umbilicus was prepped with ChloraPrep in a sterile fashion, and a sterile drape was applied covering the operative field. A sterile gown and sterile gloves were used for the procedure. Local anesthesia was provided with 1% Lidocaine.  Previous imaging reviewed. Preliminary ultrasound performed. Several solid large echogenic masses demonstrated throughout the liver correlating with the CT. An anterior midline approach is noted into a left hepatic lesion traversing normal tissue. Under sterile conditions and local anesthesia, a 17 gauge 6.8 cm access needle was advanced from an anterior midline window into a left hepatic lobe echogenic mass. Needle position confirmed with ultrasound. 18 gauge core biopsies obtained. These were placed in formalin. Needle tract embolized with Gel-Foam. Postprocedure imaging demonstrates no hemorrhage or hematoma. Patient tolerated the biopsy well.  COMPLICATIONS: No immediate   FINDINGS: Imaging confirms needle placement in a large left hepatic echogenic mass  IMPRESSION: Successful ultrasound large left hepatic mass 18 gauge core biopsy   Electronically Signed   By: Daryll Brod M.D.   On: 01/30/2014 16:02    ASSESSMENT: 38 y.o. [place] man s/p liver biopsy 01/30/2013 for what by scans appears most consistent with stage IV rectal cancer, with involvement of the liver and lungs  PLAN: We spent the better part of today's hour-long appointment discussing the biology of rectal cancer in  general, and the specifics of the patient's tumor in particular. Mr. Kasim understands stage IV rectal cancer is unfortunately not curable with our currnet knowledge base. It is very treatable, and treatment generally prolongs life, improves symptoms, and in his case may prevent bowel obstruction. We specifically discussed FOLFOX chemotherapy which would be my recommendation if the patient does have rectal adenocarcinoma, as we belie is the case.  Mr Moskal's social situation is complex. It may be possible for him to be released from his [place] halfway house to live with his fiancee, Clemetine Marker in [place] (near [place]). In that case most or all of his treatment would take place there. The patient has asked me to contact the Case manager at his halfway house, [place], [phone], to start the process and I will do that today.  It would be helpful if the patient could complete a HCPOA document and have it notarized prior to discharge-- will place a SW consult to facilitate this.  I would avoid narcotics for pain, using tramadol and naproxen instead. Would discharge on metoclopramide 5 mg po TID AC, stool softeners 1 po BID and omeprazole 40 mg po daily.  I will follow Mr Rammel as an outpatient if he stays in [place], and arrange for referral if he moves to [place] may discharge him at your discretion.   Chauncey Cruel, MD   01/31/2014 11:29 AM

## 2014-01-31 NOTE — Progress Notes (Signed)
INITIAL NUTRITION ASSESSMENT  DOCUMENTATION CODES Per approved criteria  -Not Applicable   INTERVENTION:  Please obtain current height and weight Ensure Complete po BID, each supplement provides 350 kcal and 13 grams of protein RD to follow for nutrition care plan  NUTRITION DIAGNOSIS: Increased nutrient needs related to catabolic illness as evidenced by estimated nutrition needs  Goal: Pt to meet >/= 90% of their estimated nutrition needs   Monitor:  PO & supplemental intake, weight, labs, I/O's  Reason for Assessment: Malnutrition Screening Tool Report  38 y.o. male  Admitting Dx: Rectal cancer metastasized to lung  ASSESSMENT: 38 y.o. male who presents today c/o RUQ/epigastric abdominal pain x 6 months, bloated, 10-15 lb weight loss in 2 months, night sweats, 1-2 months of dark blood in his stool. CT performed revealed infiltrating rectal cancer with perirectal adenopathy, diffuse hepatic metastatic disease and pulmonary metastatic disease.   RD briefly spoke with pt prior to MD's arrival for consultation; pt stated his appetite was "lousy;" 2nd attempt pt was preparing for X-ray; per H&P pt has had a 10-15 weight loss in the past 2 months; PO intake at 50% per flowsheet records; would benefit from addition of oral nutrition supplements -- will order.  RD suspects some level of malnutrition, however, unable to identify at this time.  Height: Ht Readings from Last 1 Encounters:  No data found for Ht    Weight: Wt Readings from Last 1 Encounters:  No data found for Wt    Ideal Body Weight: unable to assess  % Ideal Body Weight: unable to assess  Wt Readings from Last 10 Encounters:  No data found for Wt    Usual Body Weight: unable to assess  % Usual Body Weight: unable to assess  BMI:  There is no height or weight on file to calculate.  Estimated Nutritional Needs: Kcal: > Protein: > unable to assess Fluid: >  Skin: Intact  Diet Order:  General  EDUCATION NEEDS: -No education needs identified at this time   Intake/Output Summary (Last 24 hours) at 01/31/14 1454 Last data filed at 01/31/14 0600  Gross per 24 hour  Intake    480 ml  Output      0 ml  Net    480 ml   Labs:   Recent Labs Lab 01/30/14 1044 01/31/14 0418  NA 144 140  K 4.0 4.3  CL 102 97  CO2 26 24  BUN 15 15  CREATININE 1.04 1.06  CALCIUM 9.4 9.7  GLUCOSE 95 96    Scheduled Meds: . docusate sodium  200 mg Oral BID  . famotidine  10 mg Oral Daily  . metoCLOPramide  5 mg Oral TID AC  . metoprolol tartrate  25 mg Oral BID  . pantoprazole  40 mg Oral Daily    Continuous Infusions:   Past Medical History  Diagnosis Date  . Hypertension   . 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 GERD (gastroesophageal reflux disease)     Past Surgical History  Procedure Laterality Date  . Liver biopsy  01/30/2014    Arthur Holms, RD, LDN Pager #: [phone] After-Hours Pager #: [phone]

## 2014-02-01 ENCOUNTER — Other Ambulatory Visit: Payer: Self-pay | Admitting: Oncology

## 2014-02-01 DIAGNOSIS — K5900 Constipation, unspecified: Secondary | ICD-10-CM | POA: Diagnosis present

## 2014-02-01 LAB — URINE CULTURE: Colony Count: 3000

## 2014-02-01 MED ORDER — BISACODYL 10 MG RE SUPP
10.0000 mg | Freq: Every day | RECTAL | Status: DC
  Administered 2014-02-01 – 2014-02-02 (×2): 10 mg via RECTAL
  Filled 2014-02-01 (×2): qty 1

## 2014-02-01 NOTE — Progress Notes (Addendum)
[facility] PROGRESS NOTE  Andrew Gomez IPJ:[id number] DOB: 08-15-1976 DOA: 01/30/2014 PCP: No PCP Per Patient  Brief narrative 38 y.o. male, with no known medical problems, was recently released from prison after 10 years into halfway house for reintegration presented with about 6 month history of unintentional weight loss of over 15 pounds, night sweats, early satiety , intermittent blood in stool, abdominal bloating, some difficulty in passing urine at times, presented to the ER where his blood work showed mild liver insufficiency, abdominal CT scan was suggestive of metastatic infiltrated rectal cancer with metastases to liver and lungs.  Assessment/Plan:  Stage IV rectal cancer with metastases to liver and lungs.  status post CT-guided liver biopsy on 01/31/2014 by IR, No further recommendation for GI. Seen by oncology Dr Jana Hakim .  He needs to complete a HCPOA document and have it notarized prior to discharge.  Dr Jana Hakim discussed with pt regarfing options of chemotherapy that can prolong life. He agrees to go ahead with chemo if biopsy confirms  malignancy.  Will inform Ms Brooks Sailors at halfway house about treatment plan as outpt. Avoid narcotics for pain control. On naproxen and tramadol. Added reglan and PPI.   Constipation  ON colace 200 mg bid and miralax daily. Has not had BM for past 2 days. Will order dulcolax suppository.    Diet : regular   Code Status: full Family Communication: none at bedside. Contact Tressia Miners ( friend) [phone] Disposition Plan: return to halfway home possibly in am   Consultants:  Dr Jana Hakim  Procedures:  none  Antibiotics:  none  HPI/Subjective: Patient seen and examined . Reports RLQ pain. He wants to proceed to with chemotherapy when appropriate.   Objective: Filed Vitals:   02/01/14 0955  BP: 127/81  Pulse: 115  Temp:   Resp: 20   No intake or output data in the 24 hours ending 02/01/14 1434 There were no vitals  filed for this visit.  Exam:   General:  Middle aged male in NAD  HEENT: no pallor, moist mucosa   chest:c lear b/l, no added sounds   CVS: N S1&S2, no murmurs, rubs gallop   abd: soft, ND, mild tenderness over RLQ, BS+  Ext: warm, no edema  CNS: AAOX3  Data Reviewed: Basic Metabolic Panel:  Recent Labs Lab 01/30/14 1044 01/31/14 0418  NA 144 140  K 4.0 4.3  CL 102 97  CO2 26 24  GLUCOSE 95 96  BUN 15 15  CREATININE 1.04 1.06  CALCIUM 9.4 9.7   Liver Function Tests:  Recent Labs Lab 01/30/14 1044  AST 95*  ALT 83*  ALKPHOS 313*  BILITOT 1.5*  PROT 7.8  ALBUMIN 3.4*    Recent Labs Lab 01/30/14 1044  LIPASE 26   No results found for this basename: AMMONIA,  in the last 168 hours CBC:  Recent Labs Lab 01/30/14 1044 01/31/14 0418  WBC 6.5 8.1  HGB 9.8* 9.8*  HCT 29.3* 29.8*  MCV 82.5 82.8  PLT 305 321   Cardiac Enzymes: No results found for this basename: CKTOTAL, CKMB, CKMBINDEX, TROPONINI,  in the last 168 hours BNP (last 3 results) No results found for this basename: PROBNP,  in the last 8760 hours CBG: No results found for this basename: GLUCAP,  in the last 168 hours  No results found for this or any previous visit (from the past 240 hour(s)).   Studies: Dg Chest 2 View  01/31/2014   CLINICAL DATA:  Hypertension, history of rectal carcinoma  EXAM: CHEST  2 VIEW  COMPARISON:  01/30/2014  FINDINGS: Cardiac shadow is within normal limits. The lungs are well aerated bilaterally. The patient's known no pulmonary nodules are not well appreciated on this exam due to a size. No bony abnormality is seen.  IMPRESSION: No active cardiopulmonary disease.   Electronically Signed   By: Inez Catalina M.D.   On: 01/31/2014 15:41   Ir US Guide Bx Asp/drain  01/30/2014   CLINICAL DATA:  Numerous liver lesions compatible with metastases, rectal mass, presenting metastatic colorectal cancer  EXAM: ULTRASOUND GUIDED CORE BIOPSY OF LEFT HEPATIC MASS   MEDICATIONS: 3 mg IV Versed; 100 mcg IV Fentanyl  Total Moderate Sedation Time: 15 MIN  PROCEDURE: The procedure, risks, benefits, and alternatives were explained to the patient. Questions regarding the procedure were encouraged and answered. The patient understands and consents to the procedure.  The abdominal midline above the umbilicus was prepped with ChloraPrep in a sterile fashion, and a sterile drape was applied covering the operative field. A sterile gown and sterile gloves were used for the procedure. Local anesthesia was provided with 1% Lidocaine.  Previous imaging reviewed. Preliminary ultrasound performed. Several solid large echogenic masses demonstrated throughout the liver correlating with the CT. An anterior midline approach is noted into a left hepatic lesion traversing normal tissue. Under sterile conditions and local anesthesia, a 17 gauge 6.8 cm access needle was advanced from an anterior midline window into a left hepatic lobe echogenic mass. Needle position confirmed with ultrasound. 18 gauge core biopsies obtained. These were placed in formalin. Needle tract embolized with Gel-Foam. Postprocedure imaging demonstrates no hemorrhage or hematoma. Patient tolerated the biopsy well.  COMPLICATIONS: No immediate  FINDINGS: Imaging confirms needle placement in a large left hepatic echogenic mass  IMPRESSION: Successful ultrasound large left hepatic mass 18 gauge core biopsy   Electronically Signed   By: Daryll Brod M.D.   On: 01/30/2014 16:02    Scheduled Meds: . docusate sodium  200 mg Oral BID  . famotidine  10 mg Oral Daily  . feeding supplement (ENSURE COMPLETE)  237 mL Oral BID BM  . metoCLOPramide  5 mg Oral TID AC  . metoprolol tartrate  25 mg Oral BID  . pantoprazole  40 mg Oral Daily   Continuous Infusions:     Time spent: 25 minutes    Pa Tennant  [facility] Pager [phone]. If 7PM-7AM, please contact night-coverage at [email or web address], password  [facility] 02/01/2014, 2:34 PM  LOS: 2 days

## 2014-02-02 MED ORDER — TRAMADOL HCL 50 MG PO TABS: 50.0000 mg | ORAL_TABLET | Freq: Four times a day (QID) | ORAL | Status: AC | PRN

## 2014-02-02 MED ORDER — IBUPROFEN 600 MG PO TABS: 600.0000 mg | ORAL_TABLET | Freq: Four times a day (QID) | ORAL | Status: DC | PRN

## 2014-02-02 MED ORDER — POLYETHYLENE GLYCOL 3350 17 G PO PACK: 17.0000 g | PACK | Freq: Every day | ORAL | Status: AC

## 2014-02-02 MED ORDER — METOCLOPRAMIDE HCL 5 MG PO TABS: 5.0000 mg | ORAL_TABLET | Freq: Three times a day (TID) | ORAL | Status: DC

## 2014-02-02 MED ORDER — PANTOPRAZOLE SODIUM 40 MG PO TBEC: 40.0000 mg | DELAYED_RELEASE_TABLET | Freq: Every day | ORAL | Status: AC

## 2014-02-02 MED ORDER — ENSURE COMPLETE PO LIQD: 237.0000 mL | Freq: Two times a day (BID) | ORAL | Status: DC

## 2014-02-02 NOTE — Clinical Social Work Note (Signed)
CSW received consult for assistance in advance directives. CSW consulted with [employer] regarding assistance advance directives as pt is to be discharged today (per Therapist, sports). [facility] to follow-up with pt regarding advance directives. CSW signing off.  Pati Gallo, [place] Social Worker [phone]

## 2014-02-02 NOTE — Discharge Summary (Signed)
Discharge education complete.  Current medications reviewed. Went over prescription with patient. Went over infection prevention measures. Community resources reviewed with patients. Mychart reviewed with patient.

## 2014-02-02 NOTE — Discharge Summary (Addendum)
Physician Discharge Summary  Andrew Gomez XKG:[id number] DOB: [birth year] DOA: 01/30/2014  PCP: No PCP Per Patient  Admit date: 01/30/2014 Discharge date: 02/02/2014  Time spent: 40 minutes  Recommendations for Outpatient Follow-up:  1. Discharge to halfway house with outpt follow up with Dr Andrew Gomez in 1 -2 weeks. 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 male, with no known medical problems, was recently released from prison after 10 years into halfway house for reintegration presented with about 6 month history of unintentional weight loss of over 15 pounds, night sweats, early satiety , intermittent blood in stool, abdominal bloating, some difficulty in passing urine at times, presented to the ER where his blood work showed mild liver insufficiency, abdominal CT scan was suggestive of metastatic infiltrated rectal cancer with metastases to liver and lungs.   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  status post CT-guided liver biopsy on 01/31/2014 by IR, biopsy results pending No further recommendation per  GI. Seen by oncology Dr Andrew Gomez . Dr Andrew Gomez discussed with pt regarfing options of chemotherapy that can prolong life. He agrees to go ahead with chemo if biopsy confirms malignancy.  He needs to complete a HCPOA document and have it notarized prior to discharge. Seen by chaplain who instructed him on filling out the form. - informed Ms Andrew Gomez at halfway house about treatment plan as outpt and providing flexibility t to patient in regards to his  treatment. Avoid narcotics for pain control. On naproxen and tramadol. Added reglan and PPI.   Constipation  Discharge on colace 200 mg bid and miralax daily.    Diet : regular   Code Status: full   Family Communication: none at bedside.  Disposition Plan: return to halfway home  Consultants:  Dr Andrew Gomez   Procedures:  None   Antibiotics:  none   Discharge Exam: Filed Vitals:   02/02/14 0508  BP: 125/72  Pulse: 109  Temp: 99.4 F (37.4 C)  Resp: 18    General: Middle aged male in NAD  HEENT: no pallor, moist mucosa  chest:c lear b/l, no added sounds  CVS: N S1&S2, no murmurs, rubs gallop  abd: soft, ND, mild tenderness over RLQ, BS+  Ext: warm, no edema  CNS: AAOX3   Discharge Instructions You were cared for by a hospitalist during your hospital stay. If you have any questions about your discharge medications or the care you received while you were in the hospital after you are discharged, you can call the unit and asked to speak with the hospitalist on call if the hospitalist that took care of you is not available. Once you are discharged, your primary care physician will handle any further medical issues. Please note that NO REFILLS for any discharge medications will be authorized once you are discharged, as it is imperative that you return to your primary care physician (or establish a relationship with a primary care physician if you do not have one) for your aftercare needs so that they can reassess your need for medications and monitor your lab values.  Medication List    STOP taking these medications       ranitidine 150 MG tablet  Commonly known as:  ZANTAC      TAKE these medications       dicyclomine 10 MG capsule  Commonly known as:  BENTYL  Take 10 mg by mouth 3 (three) times daily before meals.     docusate sodium 100 MG capsule  Commonly known as:  COLACE  Take 100 mg by mouth 2 (two) times daily.     feeding supplement (ENSURE  COMPLETE) Liqd  Take 237 mLs by mouth 2 (two) times daily between meals.     ibuprofen 600 MG tablet  Commonly known as:  ADVIL,MOTRIN  Take 1 tablet (600 mg total) by mouth every 6 (six) hours as needed for headache.     metoCLOPramide 5 MG tablet  Commonly known as:  REGLAN  Take 1 tablet (5 mg total) by mouth 3 (three) times daily before meals.     pantoprazole 40 MG tablet  Commonly known as:  PROTONIX  Take 1 tablet (40 mg total) by mouth daily.     polyethylene glycol packet  Commonly known as:  MIRALAX / GLYCOLAX  Take 17 g by mouth daily.     traMADol 50 MG tablet  Commonly known as:  ULTRAM  Take 1 tablet (50 mg total) by mouth every 6 (six) hours as needed for moderate pain.       No Known Allergies    The results of significant diagnostics from this hospitalization (including imaging, microbiology, ancillary and laboratory) are listed below for reference.    Significant Diagnostic Studies: Dg Chest 2 View  01/31/2014   CLINICAL DATA:  Hypertension, history of rectal carcinoma  EXAM: CHEST  2 VIEW  COMPARISON:  01/30/2014  FINDINGS: Cardiac shadow is within normal limits. The lungs are well aerated bilaterally. The patient's known no pulmonary nodules are not well appreciated on this exam due to a size. No bony abnormality is seen.  IMPRESSION: No active cardiopulmonary disease.   Electronically Signed   By: Andrew Gomez M.D.   On: 01/31/2014 15:41   Dg Chest 2 View  01/30/2014   CLINICAL DATA:  Abdominal pain  EXAM: CHEST - 2 VIEW  COMPARISON:  None available  FINDINGS: Lungs are clear. Heart size and mediastinal contours are within normal limits. No effusion. Visualized skeletal structures are unremarkable.  IMPRESSION: No acute cardiopulmonary disease.   Electronically Signed   By: Andrew Gomez M.D.   On: 01/30/2014 11:36   Ct Abdomen Pelvis W Contrast  01/30/2014   CLINICAL DATA:  Blood in stools and diffuse abdominal pain.  EXAM: CT ABDOMEN AND PELVIS WITH  CONTRAST  TECHNIQUE: Multidetector CT imaging of the abdomen and pelvis was performed using the standard protocol following bolus administration of intravenous contrast.  CONTRAST:  141mL OMNIPAQUE IOHEXOL 300 MG/ML  SOLN  COMPARISON:  None.  FINDINGS: The lung bases demonstrate small bilateral pulmonary nodules suspicious for metastatic disease. The heart is normal in size. No pericardial effusion.  The liver is markedly enlarged and diffusely infiltrated with tumor. There are head numerous lesions and both lobes of liver consistent with metastatic disease. The largest lesion in the left lobe on image number 12 measures 11 x 10 cm. An indexed lesion in the right lobe on image number 33 measures 7.4 x 6.7 cm. The gallbladder is not well visualized and likely compressed. No common bile duct dilatation. The pancreas appears normal.  The spleen is normal in size. No focal lesions. The portal and splenic veins are patent. The adrenal glands and kidneys are unremarkable. There is mild compression of the right kidney by the enlarged right hepatic lobe.  The stomach, duodenum and small bowel are unremarkable. No mass lesions or obstructive findings. The colon demonstrates moderate stool. There is irregular and ill-defined wall thickening involving the upper rectum near the rectosigmoid junction suspicious for infiltrating and likely constricting tumor. There is also significant perirectal interstitial change and adenopathy. No enlarged retroperitoneal lymph nodes and no findings for omental or peritoneal surface disease. No inguinal adenopathy.  The bony structures are unremarkable.  IMPRESSION: Infiltrating rectal cancer with perirectal adenopathy, diffuse hepatic metastatic disease and pulmonary metastatic disease.   Electronically Signed   By: Kalman Jewels M.D.   On: 01/30/2014 12:46   Ir US Guide Bx Asp/drain  01/30/2014   CLINICAL DATA:  Numerous liver lesions compatible with metastases, rectal mass, presenting  metastatic colorectal cancer  EXAM: ULTRASOUND GUIDED CORE BIOPSY OF LEFT HEPATIC MASS  MEDICATIONS: 3 mg IV Versed; 100 mcg IV Fentanyl  Total Moderate Sedation Time: 15 MIN  PROCEDURE: The procedure, risks, benefits, and alternatives were explained to the patient. Questions regarding the procedure were encouraged and answered. The patient understands and consents to the procedure.  The abdominal midline above the umbilicus was prepped with ChloraPrep in a sterile fashion, and a sterile drape was applied covering the operative field. A sterile gown and sterile gloves were used for the procedure. Local anesthesia was provided with 1% Lidocaine.  Previous imaging reviewed. Preliminary ultrasound performed. Several solid large echogenic masses demonstrated throughout the liver correlating with the CT. An anterior midline approach is noted into a left hepatic lesion traversing normal tissue. Under sterile conditions and local anesthesia, a 17 gauge 6.8 cm access needle was advanced from an anterior midline window into a left hepatic lobe echogenic mass. Needle position confirmed with ultrasound. 18 gauge core biopsies obtained. These were placed in formalin. Needle tract embolized with Gel-Foam. Postprocedure imaging demonstrates no hemorrhage or hematoma. Patient tolerated the biopsy well.  COMPLICATIONS: No immediate  FINDINGS: Imaging confirms needle placement in a large left hepatic echogenic mass  IMPRESSION: Successful ultrasound large left hepatic mass 18 gauge core biopsy   Electronically Signed   By: Daryll Brod M.D.   On: 01/30/2014 16:02    Microbiology: Recent Results (from the past 240 hour(s))  URINE CULTURE     Status: None   Collection Time    01/31/14  1:36 PM      Result Value Ref Range Status   Specimen Description URINE, CLEAN CATCH   Final   Special Requests NONE   Final   Culture  Setup Time     Final   Value: 01/31/2014 21:44     Performed at [facility]      Final   Value: 3,000 COLONIES/ML     Performed at [facility]   Culture     Final   Value: INSIGNIFICANT GROWTH     Performed at [facility]   Report Status 02/01/2014 FINAL   Final     Labs: Basic Metabolic Panel:  Recent Labs Lab 01/30/14 1044 01/31/14 0418  NA 144 140  K 4.0 4.3  CL 102 97  CO2 26 24  GLUCOSE 95 96  BUN 15 15  CREATININE 1.04 1.06  CALCIUM 9.4 9.7   Liver Function Tests:  Recent Labs Lab 01/30/14 1044  AST 95*  ALT 83*  ALKPHOS 313*  BILITOT 1.5*  PROT 7.8  ALBUMIN 3.4*    Recent Labs Lab 01/30/14 1044  LIPASE 26   No results found for this basename: AMMONIA,  in the last 168 hours CBC:  Recent Labs Lab 01/30/14 1044 01/31/14 0418  WBC 6.5 8.1  HGB 9.8* 9.8*  HCT 29.3* 29.8*  MCV 82.5 82.8  PLT 305 321   Cardiac Enzymes: No results found for this basename: CKTOTAL, CKMB, CKMBINDEX, TROPONINI,  in the last 168 hours BNP: BNP (last 3 results) No results found for this basename: PROBNP,  in the last 8760 hours CBG: No results found for this basename: GLUCAP,  in the last 168 hours     Signed:  Louellen Molder  [facility] 02/02/2014, 12:23 PM

## 2014-02-02 NOTE — Progress Notes (Signed)
Chaplain met with pt regarding AD.  Pt wanted to take some time to look over the document and discuss possible options with those close to him.  Chaplain understands pt's wishes to not move forward on the AD until discussing it further.  Pt was informed on how to fill the document out by chaplain.  Pt seemed grateful for chaplain presence and information on the AD. °

## 2014-02-03 ENCOUNTER — Encounter: Payer: Self-pay | Admitting: Oncology

## 2014-02-03 LAB — TYPE AND SCREEN
ABO/RH(D): A POS
Antibody Screen: NEGATIVE
Unit division: 0
Unit division: 0

## 2014-02-04 ENCOUNTER — Emergency Department (HOSPITAL_COMMUNITY)
Admission: EM | Admit: 2014-02-04 | Discharge: 2014-02-05 | Disposition: A | Discharge status: Home / Self Care | Payer: Self-pay | Attending: Emergency Medicine | Admitting: Emergency Medicine

## 2014-02-04 ENCOUNTER — Telehealth: Payer: Self-pay | Admitting: *Deleted

## 2014-02-04 ENCOUNTER — Encounter (HOSPITAL_COMMUNITY): Payer: Self-pay | Admitting: Emergency Medicine

## 2014-02-04 DIAGNOSIS — C7800 Secondary malignant neoplasm of unspecified lung: Secondary | ICD-10-CM | POA: Insufficient documentation

## 2014-02-04 DIAGNOSIS — K219 Gastro-esophageal reflux disease without esophagitis: Secondary | ICD-10-CM | POA: Insufficient documentation

## 2014-02-04 DIAGNOSIS — R1084 Generalized abdominal pain: Secondary | ICD-10-CM | POA: Insufficient documentation

## 2014-02-04 DIAGNOSIS — C20 Malignant neoplasm of rectum: Secondary | ICD-10-CM | POA: Insufficient documentation

## 2014-02-04 DIAGNOSIS — K3189 Other diseases of stomach and duodenum: Secondary | ICD-10-CM | POA: Insufficient documentation

## 2014-02-04 DIAGNOSIS — R109 Unspecified abdominal pain: Secondary | ICD-10-CM

## 2014-02-04 DIAGNOSIS — Z79899 Other long term (current) drug therapy: Secondary | ICD-10-CM | POA: Insufficient documentation

## 2014-02-04 DIAGNOSIS — I10 Essential (primary) hypertension: Secondary | ICD-10-CM | POA: Insufficient documentation

## 2014-02-04 DIAGNOSIS — R1013 Epigastric pain: Secondary | ICD-10-CM

## 2014-02-04 DIAGNOSIS — C787 Secondary malignant neoplasm of liver and intrahepatic bile duct: Secondary | ICD-10-CM | POA: Insufficient documentation

## 2014-02-04 MED ORDER — ONDANSETRON 8 MG PO TBDP
8.0000 mg | ORAL_TABLET | Freq: Once | ORAL | Status: AC
  Administered 2014-02-04: 8 mg via ORAL
  Filled 2014-02-04: qty 1

## 2014-02-04 NOTE — Telephone Encounter (Signed)
Per Dr Jana Hakim, called and spoke with America Brown, Case Manager, to see if patient is being seen at another office. Patient needs follow up in next 1-2 weeks for newly diagnosed cancer, per discharge instructions. Ms Brooks Sailors states that patient care is being handled by [employer], since he is under custody of the state. She would not disclose any other information of Oncologist, other than he will soon be transferred to [facility] for his care. Called and spoke with fiance Clemetine Marker, to inform her when she speaks to patient to let him know we have sent mail to his last address on [address] suggesting he see MD soon. She states she only gets to speak with him every few days and will share this with him.

## 2014-02-04 NOTE — ED Notes (Signed)
Pt transported from half way house with c/o n/v 4/6. Pt was tx at [facility] for same 4/6. S/s no better at this time.

## 2014-02-04 NOTE — ED Notes (Signed)
Pt states that he has cancer of liver and lungs. Pt reports stomach bloated and burning. Pt states that he tries to drink fluid and it comes back up. Pt reports vomiting x3. Pt alert and ambulatory in triage.

## 2014-02-05 ENCOUNTER — Emergency Department (HOSPITAL_COMMUNITY): Payer: Self-pay

## 2014-02-05 ENCOUNTER — Inpatient Hospital Stay (HOSPITAL_COMMUNITY)
Admission: EM | Admit: 2014-02-05 | Discharge: 2014-02-09 | DRG: 392 | Disposition: A | Discharge status: Home / Self Care | Payer: Self-pay | Attending: Internal Medicine | Admitting: Internal Medicine

## 2014-02-05 ENCOUNTER — Encounter (HOSPITAL_COMMUNITY): Payer: Self-pay | Admitting: Emergency Medicine

## 2014-02-05 DIAGNOSIS — C787 Secondary malignant neoplasm of liver and intrahepatic bile duct: Secondary | ICD-10-CM | POA: Diagnosis present

## 2014-02-05 DIAGNOSIS — I10 Essential (primary) hypertension: Secondary | ICD-10-CM | POA: Diagnosis present

## 2014-02-05 DIAGNOSIS — R634 Abnormal weight loss: Secondary | ICD-10-CM

## 2014-02-05 DIAGNOSIS — R509 Fever, unspecified: Secondary | ICD-10-CM | POA: Diagnosis not present

## 2014-02-05 DIAGNOSIS — C7951 Secondary malignant neoplasm of bone: Secondary | ICD-10-CM | POA: Diagnosis present

## 2014-02-05 DIAGNOSIS — C7800 Secondary malignant neoplasm of unspecified lung: Secondary | ICD-10-CM | POA: Diagnosis present

## 2014-02-05 DIAGNOSIS — C799 Secondary malignant neoplasm of unspecified site: Secondary | ICD-10-CM | POA: Diagnosis present

## 2014-02-05 DIAGNOSIS — K219 Gastro-esophageal reflux disease without esophagitis: Secondary | ICD-10-CM | POA: Diagnosis present

## 2014-02-05 DIAGNOSIS — C20 Malignant neoplasm of rectum: Secondary | ICD-10-CM | POA: Diagnosis present

## 2014-02-05 DIAGNOSIS — Z79899 Other long term (current) drug therapy: Secondary | ICD-10-CM

## 2014-02-05 DIAGNOSIS — K5900 Constipation, unspecified: Principal | ICD-10-CM | POA: Diagnosis present

## 2014-02-05 DIAGNOSIS — D62 Acute posthemorrhagic anemia: Secondary | ICD-10-CM | POA: Diagnosis present

## 2014-02-05 DIAGNOSIS — C7952 Secondary malignant neoplasm of bone marrow: Secondary | ICD-10-CM

## 2014-02-05 DIAGNOSIS — R109 Unspecified abdominal pain: Secondary | ICD-10-CM | POA: Diagnosis present

## 2014-02-05 DIAGNOSIS — K625 Hemorrhage of anus and rectum: Secondary | ICD-10-CM

## 2014-02-05 LAB — CBC WITH DIFFERENTIAL/PLATELET
BASOS ABS: 0 10*3/uL (ref 0.0–0.1)
BASOS PCT: 0 % (ref 0–1)
Basophils Absolute: 0 10*3/uL (ref 0.0–0.1)
Basophils Relative: 0 % (ref 0–1)
Eosinophils Absolute: 0.1 10*3/uL (ref 0.0–0.7)
Eosinophils Absolute: 0.1 10*3/uL (ref 0.0–0.7)
Eosinophils Relative: 2 % (ref 0–5)
Eosinophils Relative: 1 % (ref 0–5)
HCT: 26 % — ABNORMAL LOW (ref 39.0–52.0)
HEMATOCRIT: 26.6 % — AB (ref 39.0–52.0)
Hemoglobin: 8.8 g/dL — ABNORMAL LOW (ref 13.0–17.0)
Hemoglobin: 8.7 g/dL — ABNORMAL LOW (ref 13.0–17.0)
LYMPHS PCT: 12 % (ref 12–46)
Lymphocytes Relative: 13 % (ref 12–46)
Lymphs Abs: 0.8 10*3/uL (ref 0.7–4.0)
Lymphs Abs: 0.7 10*3/uL (ref 0.7–4.0)
MCH: 26.9 pg (ref 26.0–34.0)
MCH: 27.3 pg (ref 26.0–34.0)
MCHC: 33.1 g/dL (ref 30.0–36.0)
MCHC: 33.5 g/dL (ref 30.0–36.0)
MCV: 81.3 fL (ref 78.0–100.0)
MCV: 81.5 fL (ref 78.0–100.0)
Monocytes Absolute: 0.8 10*3/uL (ref 0.1–1.0)
Monocytes Absolute: 0.8 10*3/uL (ref 0.1–1.0)
Monocytes Relative: 12 % (ref 3–12)
Monocytes Relative: 14 % — ABNORMAL HIGH (ref 3–12)
NEUTROS ABS: 4.1 10*3/uL (ref 1.7–7.7)
NEUTROS ABS: 4.5 10*3/uL (ref 1.7–7.7)
NEUTROS PCT: 72 % (ref 43–77)
NEUTROS PCT: 73 % (ref 43–77)
PLATELETS: 322 10*3/uL (ref 150–400)
Platelets: 289 10*3/uL (ref 150–400)
RBC: 3.27 MIL/uL — ABNORMAL LOW (ref 4.22–5.81)
RBC: 3.19 MIL/uL — ABNORMAL LOW (ref 4.22–5.81)
RDW: 13.4 % (ref 11.5–15.5)
RDW: 13.5 % (ref 11.5–15.5)
WBC: 5.7 10*3/uL (ref 4.0–10.5)
WBC: 6.1 10*3/uL (ref 4.0–10.5)

## 2014-02-05 LAB — COMPREHENSIVE METABOLIC PANEL
ALK PHOS: 269 U/L — AB (ref 39–117)
ALT: 87 U/L — AB (ref 0–53)
ALT: 94 U/L — AB (ref 0–53)
AST: 95 U/L — AB (ref 0–37)
AST: 99 U/L — ABNORMAL HIGH (ref 0–37)
Albumin: 3 g/dL — ABNORMAL LOW (ref 3.5–5.2)
Albumin: 3.1 g/dL — ABNORMAL LOW (ref 3.5–5.2)
Alkaline Phosphatase: 292 U/L — ABNORMAL HIGH (ref 39–117)
BILIRUBIN TOTAL: 1.4 mg/dL — AB (ref 0.3–1.2)
BUN: 18 mg/dL (ref 6–23)
BUN: 19 mg/dL (ref 6–23)
CALCIUM: 9.3 mg/dL (ref 8.4–10.5)
CHLORIDE: 97 meq/L (ref 96–112)
CO2: 23 meq/L (ref 19–32)
CO2: 24 meq/L (ref 19–32)
Calcium: 9.5 mg/dL (ref 8.4–10.5)
Chloride: 103 mEq/L (ref 96–112)
Creatinine, Ser: 1.19 mg/dL (ref 0.50–1.35)
Creatinine, Ser: 1.23 mg/dL (ref 0.50–1.35)
GFR calc Af Amer: 87 mL/min — ABNORMAL LOW (ref >90)
GFR calc non Af Amer: 78 mL/min — ABNORMAL LOW (ref >90)
GFR, EST NON AFRICAN AMERICAN: 75 mL/min — AB (ref >90)
GLUCOSE: 100 mg/dL — AB (ref 70–99)
Glucose, Bld: 96 mg/dL (ref 70–99)
POTASSIUM: 4.1 meq/L (ref 3.7–5.3)
Potassium: 4 mEq/L (ref 3.7–5.3)
SODIUM: 137 meq/L (ref 137–147)
Sodium: 143 mEq/L (ref 137–147)
Total Bilirubin: 1.3 mg/dL — ABNORMAL HIGH (ref 0.3–1.2)
Total Protein: 7.2 g/dL (ref 6.0–8.3)
Total Protein: 7.6 g/dL (ref 6.0–8.3)

## 2014-02-05 LAB — LIPASE, BLOOD
LIPASE: 32 U/L (ref 11–59)
Lipase: 34 U/L (ref 11–59)

## 2014-02-05 LAB — TROPONIN I

## 2014-02-05 LAB — POC OCCULT BLOOD, ED: FECAL OCCULT BLD: POSITIVE — AB

## 2014-02-05 MED ORDER — ALUM & MAG HYDROXIDE-SIMETH 200-200-20 MG/5ML PO SUSP
30.0000 mL | Freq: Once | ORAL | Status: AC
  Administered 2014-02-05: 30 mL via ORAL
  Filled 2014-02-05: qty 30

## 2014-02-05 MED ORDER — HYDROMORPHONE HCL PF 1 MG/ML IJ SOLN: 1.0000 mg | INTRAMUSCULAR | Status: DC | PRN

## 2014-02-05 MED ORDER — SODIUM CHLORIDE 0.9 % IV BOLUS (SEPSIS)
1000.0000 mL | Freq: Once | INTRAVENOUS | Status: AC
  Administered 2014-02-05: 1000 mL via INTRAVENOUS

## 2014-02-05 MED ORDER — ONDANSETRON HCL 4 MG/2ML IJ SOLN: 4.0000 mg | Freq: Three times a day (TID) | INTRAMUSCULAR | Status: DC | PRN

## 2014-02-05 MED ORDER — ONDANSETRON HCL 4 MG/2ML IJ SOLN
4.0000 mg | Freq: Once | INTRAMUSCULAR | Status: AC
  Administered 2014-02-05: 4 mg via INTRAVENOUS
  Filled 2014-02-05: qty 2

## 2014-02-05 MED ORDER — ONDANSETRON 8 MG PO TBDP: 8.0000 mg | ORAL_TABLET | Freq: Three times a day (TID) | ORAL | Status: AC | PRN

## 2014-02-05 MED ORDER — ALUM & MAG HYDROXIDE-SIMETH 200-200-20 MG/5ML PO SUSP: 30.0000 mL | Freq: Four times a day (QID) | ORAL | Status: AC | PRN

## 2014-02-05 MED ORDER — HYDROMORPHONE HCL PF 1 MG/ML IJ SOLN
1.0000 mg | Freq: Once | INTRAMUSCULAR | Status: AC
  Administered 2014-02-05: 1 mg via INTRAVENOUS
  Filled 2014-02-05: qty 1

## 2014-02-05 MED ORDER — HYDROMORPHONE HCL 2 MG PO TABS
2.0000 mg | ORAL_TABLET | ORAL | Status: DC | PRN
Start: 2014-02-05 — End: 2014-02-08

## 2014-02-05 MED ORDER — GI COCKTAIL ~~LOC~~
30.0000 mL | Freq: Once | ORAL | Status: AC
  Administered 2014-02-05: 30 mL via ORAL
  Filled 2014-02-05: qty 30

## 2014-02-05 NOTE — ED Notes (Signed)
Pt stating he is having pain in chest radiating up from his stomach, EKG obtained and taken to MD

## 2014-02-05 NOTE — ED Provider Notes (Signed)
CSN: [id number]     Arrival date & time 02/05/14  1740 History   First MD Initiated Contact with Patient 02/05/14 2111     Chief Complaint  Patient presents with  . Abdominal Pain     (Consider location/radiation/quality/duration/timing/severity/associated sxs/prior Treatment) HPI Comments: Complains of abdominal pain, nausea, vomiting, unable to eat and drink. He was diagnosed with metastatic rectal cancer with metastases to lung and liver. His halfway house currently. He is not currently taking any chemotherapy. He is not taking anything at home for the pain. He was prescribed Dilaudid tablets last night which is not filled which is a halfway house did not give him. Today he noticed frequent episodes of bloody stools with clots.  he endorses worsening abdominal pain that radiates to his back. No fever. Denies any dizziness or lightheadedness.   The history is provided by the patient.    Past Medical History  Diagnosis Date  . Hypertension   . 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 GERD (gastroesophageal reflux disease)    Past Surgical History  Procedure Laterality Date  . Liver biopsy  01/30/2014   Family History  Problem Relation Age of Onset  . Family history unknown: Yes   History  Substance Use Topics  . Smoking status: Never Smoker   . Smokeless tobacco: Never Used  . Alcohol Use: No    Review of Systems  Constitutional: Positive for activity change, appetite change and fatigue. Negative for fever.  HENT: Negative for congestion and rhinorrhea.   Respiratory: Negative for cough, chest tightness and shortness of breath.   Cardiovascular: Negative for chest pain.  Gastrointestinal: Positive for nausea, vomiting, abdominal pain, diarrhea and blood in stool.  Genitourinary: Negative for dysuria and testicular pain.  Musculoskeletal: Negative for arthralgias, back pain and myalgias.  Skin: Negative for rash.  Neurological: Positive for weakness.  Negative for dizziness and headaches.  A complete 10 system review of systems was obtained and all systems are negative except as noted in the HPI and PMH.      Allergies  Review of patient's allergies indicates no known allergies.  Home Medications   Current Outpatient Rx  Name  Route  Sig  Dispense  Refill  . alum & mag hydroxide-simeth (MAALOX ADVANCED) 200-200-20 MG/5ML suspension   Oral   Take 30 mLs by mouth every 6 (six) hours as needed for indigestion or heartburn.   355 mL   0   . dicyclomine (BENTYL) 10 MG capsule   Oral   Take 10 mg by mouth 3 (three) times daily before meals.         . docusate sodium (COLACE) 100 MG capsule   Oral   Take 100 mg by mouth 2 (two) times daily.         Marland Kitchen HYDROmorphone (DILAUDID) 2 MG tablet   Oral   Take 1 tablet (2 mg total) by mouth every 4 (four) hours as needed for severe pain.   30 tablet   0   . ibuprofen (ADVIL,MOTRIN) 600 MG tablet   Oral   Take 1 tablet (600 mg total) by mouth every 6 (six) hours as needed for headache.   30 tablet   2   . metoCLOPramide (REGLAN) 5 MG tablet   Oral   Take 1 tablet (5 mg total) by mouth 3 (three) times daily before meals.   30 tablet   1   . ondansetron (ZOFRAN ODT) 8 MG disintegrating tablet   Oral  Take 1 tablet (8 mg total) by mouth every 8 (eight) hours as needed for nausea or vomiting.   20 tablet   0   . pantoprazole (PROTONIX) 40 MG tablet   Oral   Take 1 tablet (40 mg total) by mouth daily.   30 tablet   0   . polyethylene glycol (MIRALAX / GLYCOLAX) packet   Oral   Take 17 g by mouth daily.   14 each   0   . traMADol (ULTRAM) 50 MG tablet   Oral   Take 1 tablet (50 mg total) by mouth every 6 (six) hours as needed for moderate pain.   40 tablet   0    BP 125/87  Pulse 99  Temp(Src) 98.9 F (37.2 C) (Oral)  Resp 20  Ht 5\' 6"  (1.676 m)  Wt 160 lb 6.4 oz (72.757 kg)  BMI 25.90 kg/m2  SpO2 95% Physical Exam  Constitutional: He is oriented to  person, place, and time. He appears well-developed and well-nourished. No distress.  HENT:  Head: Normocephalic and atraumatic.  Mouth/Throat: Oropharynx is clear and moist. No oropharyngeal exudate.  Eyes: Conjunctivae and EOM are normal. Pupils are equal, round, and reactive to light.  Neck: Normal range of motion. Neck supple.  Cardiovascular: Normal rate and normal heart sounds.   No murmur heard. Pulmonary/Chest: Effort normal and breath sounds normal. No respiratory distress.  Abdominal: Soft. He exhibits mass. There is tenderness. There is no rebound and no guarding.  Patient has enlarged liver that is tender. Diffuse abdominal tenderness  Genitourinary:  Pink stool on  examining finger, no fecal impaction  Musculoskeletal: Normal range of motion. He exhibits no edema and no tenderness.  Neurological: He is oriented to person, place, and time. No cranial nerve deficit. He exhibits normal muscle tone. Coordination normal.  Skin: Skin is warm.    ED Course  Procedures (including critical care time) Labs Review Labs Reviewed  COMPREHENSIVE METABOLIC PANEL - Abnormal; Notable for the following:    Glucose, Bld 100 (*)    Albumin 3.0 (*)    AST 99 (*)    ALT 87 (*)    Alkaline Phosphatase 269 (*)    Total Bilirubin 1.3 (*)    GFR calc non Af Amer 78 (*)    All other components within normal limits  CBC WITH DIFFERENTIAL - Abnormal; Notable for the following:    RBC 3.19 (*)    Hemoglobin 8.7 (*)    HCT 26.0 (*)    Monocytes Relative 14 (*)    All other components within normal limits  URINALYSIS, ROUTINE W REFLEX MICROSCOPIC - Abnormal; Notable for the following:    APPearance CLOUDY (*)    Ketones, ur 15 (*)    All other components within normal limits  CBC - Abnormal; Notable for the following:    RBC 3.34 (*)    Hemoglobin 9.0 (*)    HCT 27.4 (*)    All other components within normal limits  POC OCCULT BLOOD, ED - Abnormal; Notable for the following:    Fecal  Occult Bld POSITIVE (*)    All other components within normal limits  LIPASE, BLOOD  TROPONIN I  TYPE AND SCREEN   Imaging Review Dg Abd Acute W/chest  02/05/2014   CLINICAL DATA:  Abdominal pain and distension  EXAM: ACUTE ABDOMEN SERIES (ABDOMEN 2 VIEW & CHEST 1 VIEW)  COMPARISON:  Prior study from 01/31/2014  FINDINGS: The cardiac and mediastinal silhouettes are stable  in size and contour, and remain within normal limits.  The lungs are normally inflated. No airspace consolidation, pleural effusion, or pulmonary edema is identified. There is no pneumothorax.  Scattered loops of gas-filled nondilated bowel are seen within the lower abdomen and pelvis. No evidence of obstruction or ileus. No free intraperitoneal air. No soft tissue mass or abnormal calcification.  No acute osseous abnormality identified.  IMPRESSION: 1. Nonobstructive bowel gas pattern with no radiographic evidence of acute intra-abdominal process. 2. No acute cardiopulmonary process identified.   Electronically Signed   By: Jeannine Boga M.D.   On: 02/05/2014 22:06     EKG Interpretation None      MDM   Final diagnoses:  Metastatic cancer  Abdominal pain   Metastatic rectal cancer with ongoing abdominal pain, nausea vomiting rectal bleeding. Did not fill prescriptions given yesterday. Tenderness to palpation in the right upper quadrant with large liver.  Labs appear to be at baseline. Hemoglobin has dropped 1 g in the past week. Fecal occult blood is positive.  AAS negative.  Mild tachycardia, BP stable.  Patient does wish to pursue chemotherapy and treatment of his cancer.  He has not yet established a plan with oncology.  With his ongoing pain and worsening rectal bleeding, will admit for observation.    Ezequiel Essex, MD 02/06/14 [phone]

## 2014-02-05 NOTE — ED Provider Notes (Signed)
CSN: [id number]     Arrival date & time 02/04/14  2117 History   First MD Initiated Contact with Patient 02/05/14 0007     Chief Complaint  Patient presents with  . Emesis     (Consider location/radiation/quality/duration/timing/severity/associated sxs/prior Treatment) HPI 38 year old male presents to emergency department with complaint of upper abdominal pain with nausea and vomiting, bloating.  Patient reports when he tries to eat or drink, he vomits it back up.  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  Patient currently living in a halfway house after being in prison for 10 years.  He found out today that he will not be allowed to receive outpatient chemotherapy treatment, but will need to return to either prison hospital for treatment.  He has been given a life expectancy of 6 months without chemotherapy, and one to 2 years with treatment.  Patient reports at this time, he does not wish to have chemotherapy.  Patient is requesting help with pain and nausea control in the meantime Past Medical History  Diagnosis Date  . Hypertension   . 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 GERD (gastroesophageal reflux disease)    Past Surgical History  Procedure Laterality Date  . Liver biopsy  01/30/2014   History reviewed. No pertinent family history. History  Substance Use Topics  . Smoking status: Never Smoker   . Smokeless tobacco: Never Used  . Alcohol Use: No    Review of Systems   See History of Present Illness; otherwise all other systems are reviewed and negative  Allergies  Review of patient's allergies indicates no known allergies.  Home Medications   Current Outpatient Rx  Name  Route  Sig  Dispense  Refill  . dicyclomine (BENTYL) 10 MG capsule   Oral   Take 10 mg by mouth 3 (three) times daily before meals.         . docusate sodium (COLACE) 100 MG capsule   Oral   Take 100 mg by mouth 2 (two)  times daily.         Marland Kitchen ibuprofen (ADVIL,MOTRIN) 600 MG tablet   Oral   Take 1 tablet (600 mg total) by mouth every 6 (six) hours as needed for headache.   30 tablet   2   . metoCLOPramide (REGLAN) 5 MG tablet   Oral   Take 1 tablet (5 mg total) by mouth 3 (three) times daily before meals.   30 tablet   1   . pantoprazole (PROTONIX) 40 MG tablet   Oral   Take 1 tablet (40 mg total) by mouth daily.   30 tablet   0   . polyethylene glycol (MIRALAX / GLYCOLAX) packet   Oral   Take 17 g by mouth daily.   14 each   0   . traMADol (ULTRAM) 50 MG tablet   Oral   Take 1 tablet (50 mg total) by mouth every 6 (six) hours as needed for moderate pain.   40 tablet   0   . alum & mag hydroxide-simeth (MAALOX ADVANCED) 200-200-20 MG/5ML suspension   Oral   Take 30 mLs by mouth every 6 (six) hours as needed for indigestion or heartburn.   355 mL   0   . HYDROmorphone (DILAUDID) 2 MG tablet   Oral   Take 1 tablet (2 mg total) by mouth every 4 (four) hours as needed for severe pain.   30 tablet  0   . ondansetron (ZOFRAN ODT) 8 MG disintegrating tablet   Oral   Take 1 tablet (8 mg total) by mouth every 8 (eight) hours as needed for nausea or vomiting.   20 tablet   0    BP 133/72  Pulse 103  Temp(Src) 98.4 F (36.9 C) (Oral)  Resp 18  Ht 5\' 6"  (1.676 m)  Wt 162 lb (73.483 kg)  BMI 26.16 kg/m2  SpO2 95% Physical Exam  Nursing note and vitals reviewed. Constitutional: He is oriented to person, place, and time. He appears well-developed and well-nourished. He appears distressed.  HENT:  Head: Normocephalic and atraumatic.  Nose: Nose normal.  Mouth/Throat: Oropharynx is clear and moist.  Eyes: Conjunctivae and EOM are normal. Pupils are equal, round, and reactive to light.  Neck: Normal range of motion. Neck supple. No JVD present. No tracheal deviation present. No thyromegaly present.  Cardiovascular: Normal rate, regular rhythm, normal heart sounds and intact  distal pulses.  Exam reveals no gallop and no friction rub.   No murmur heard. Pulmonary/Chest: Effort normal and breath sounds normal. No stridor. No respiratory distress. He has no wheezes. He has no rales. He exhibits no tenderness.  Abdominal: Soft. Bowel sounds are normal. He exhibits mass (patient has a significantly enlarged liver). He exhibits no distension. There is tenderness ( diffuse tenderness to pain). There is no rebound and no guarding.  Musculoskeletal: Normal range of motion. He exhibits no edema and no tenderness.  Lymphadenopathy:    He has no cervical adenopathy.  Neurological: He is alert and oriented to person, place, and time. He exhibits normal muscle tone. Coordination normal.  Skin: Skin is warm and dry. No rash noted. No erythema. No pallor.  Psychiatric: He has a normal mood and affect. His behavior is normal. Judgment and thought content normal.    ED Course  Procedures (including critical care time) Labs Review Labs Reviewed  CBC WITH DIFFERENTIAL - Abnormal; Notable for the following:    RBC 3.27 (*)    Hemoglobin 8.8 (*)    HCT 26.6 (*)    All other components within normal limits  COMPREHENSIVE METABOLIC PANEL - Abnormal; Notable for the following:    Albumin 3.1 (*)    AST 95 (*)    ALT 94 (*)    Alkaline Phosphatase 292 (*)    Total Bilirubin 1.4 (*)    GFR calc non Af Amer 75 (*)    GFR calc Af Amer 87 (*)    All other components within normal limits  LIPASE, BLOOD   Imaging Review No results found.   EKG Interpretation   Date/Time:  Wednesday February 04 2014 23:26:00 EDT Ventricular Rate:  104 PR Interval:  153 QRS Duration: 97 QT Interval:  337 QTC Calculation: 443 R Axis:   33 Text Interpretation:  Sinus tachycardia Borderline T abnormalities,  anterior leads No old tracing to compare Confirmed by Muath Hallam  MD, Pricella Gaugh  ([id number]) on 02/05/2014 12:22:12 AM      MDM   Final diagnoses:  Rectal cancer metastasized to lung and liver   Abdominal pain  Dyspepsia    38 year old male with metastatic cancer, who presents with nausea and vomiting and upper abdominal pain.  Labs are essentially unchanged.  Patient much improved after GI cocktail.  Patient does not wish chemotherapy at this time.  If it means that he has to go back to prison.  Will prescribe Zofran, Maalox, and Dilaudid for pain and nausea and dyspepsia.  Kalman Drape, MD 02/05/14 [phone]

## 2014-02-05 NOTE — ED Notes (Signed)
Patient stated he was having weakness. Refused wheelchair assistance.

## 2014-02-05 NOTE — ED Notes (Signed)
Pt at desk c/o new chest pain at this time, pt taken back to triage area for EKG

## 2014-02-05 NOTE — ED Notes (Signed)
[facility] called to let them know the patient is being discharged.

## 2014-02-05 NOTE — Discharge Instructions (Signed)
It is important to decide on your treatment plan.  Chemotherapy gives you a better chance at longer life, but has side effects that may affect your quality of life.  Discuss with your doctors.   Indigestion Indigestion is discomfort in the upper abdomen that is caused by underlying problems such as gastroesophageal reflux disease (GERD), ulcers, or gallbladder problems.  CAUSES  Indigestion can be caused by many things. Possible causes include:  Stomach acid in the esophagus.  Stomach infections, usually caused by the bacteria H. pylori.  Being overweight.  Hiatal hernia. This means part of the stomach pushes up through the diaphragm.  Overeating.  Emotional problems, such as stress, anxiety, or depression.  Poor nutrition.  Consuming too much alcohol, tobacco, or caffeine.  Consuming spicy foods, fats, peppermint, chocolate, tomato products, citrus, or fruit juices.  Medicines such as aspirin and other anti-inflammatory drugs, hormones, steroids, and thyroid medicines.  Gastroparesis. This is a condition in which the stomach does not empty properly.  Stomach cancer.  Pregnancy, due to an increase in hormone levels, a relaxation of muscles in the digestive tract, and pressure on the stomach from the growing fetus. SYMPTOMS   Uncomfortable feeling of fullness after eating.  Pain or burning sensation in the upper abdomen.  Bloating.  Belching and gas.  Nausea and vomiting.  Acidic taste in the mouth.  Burning sensation in the chest (heartburn). DIAGNOSIS  Your caregiver will review your medical history and perform a physical exam. Other tests, such as blood tests, stool tests, X-rays, and other imaging scans, may be done to check for more serious problems. TREATMENT  Liquid antacids and other drugs may be given to block stomach acid secretion. Medicines that increase esophageal muscle tone may also be given to help reduce symptoms. If an infection is found, antibiotic  medicine may be given. HOME CARE INSTRUCTIONS  Avoid foods and drinks that make your symptoms worse, such as:  Caffeine or alcoholic drinks.  Chocolate.  Peppermint or mint flavorings.  Garlic and onions.  Spicy foods.  Citrus fruits, such as oranges, lemons, or limes.  Tomato-based foods such as sauce, chili, salsa, and pizza.  Fried and fatty foods.  Avoid eating for the 3 hours prior to your bedtime.  Eat small, frequent meals instead of large meals.  Stop smoking if you smoke.  Maintain a healthy weight.  Wear loose-fitting clothing. Do not wear anything tight around your waist that causes pressure on your stomach.  Raise the head of your bed 4 to 8 inches with wood blocks to help you sleep. Extra pillows will not help.  Only take over-the-counter or prescription medicines as directed by your caregiver.  Do not take aspirin, ibuprofen, or other nonsteroidal anti-inflammatory drugs (NSAIDs). SEEK IMMEDIATE MEDICAL CARE IF:   You are not better after 2 days.  You have chest pressure or pain that radiates up into your neck, arms, back, jaw, or upper abdomen.  You have difficulty swallowing.  You keep vomiting.  You have black or bloody stools.  You have a fever.  You have dizziness, fainting, difficulty breathing, or heavy sweating.  You have severe abdominal pain.  You lose weight without trying. MAKE SURE YOU:  Understand these instructions.  Will watch your condition.  Will get help right away if you are not doing well or get worse. Document Released: 11/23/2004 Document Revised: 01/08/2012 Document Reviewed: 05/31/2011 [facility] Patient Information 2014 [place], [place].  Metastatic Cancer, Questions and Answers KEY POINTS  Cancer happens when  cells become abnormal and grow without control.  Where the cancer started is called the primary cancer or the primary tumor.  Metastatic cancer happens when cancer cells spread from the place where it  started to other parts of the body.  When cancer spreads, the metastatic cancer keeps the same type of cells and the same name as the primary tumor.  The most common sites of metastasis are the lungs, bones, liver, and brain.  Treatment for metastatic cancer usually depends on the type of cancer. It also depends on the size and location of the metastasis. WHAT IS CANCER?   Cancer is a group of many related diseases. All cancers begin in cells. Cells are the building blocks that make up tissues. Cancer that arises from organs and solid tissues is called a solid tumor. Cancer that begins in blood cells is called leukemia, multiple myeloma, or lymphoma.  Normally, cells grow and divide to form new cells as the body needs them. When cells grow old and die, new cells take their place. Sometimes this orderly process goes wrong. New cells form when the body does not need them. Old cells do not die when they should.  The extra cells form a mass of tissue. This is called a growth or tumor. Tumors can be either not cancerous (benign) or cancerous (malignant). Benign tumors do not spread to other parts of the body. They are rarely a threat to life. Malignant tumors can spread (metastasize) and may be life threatening. WHAT IS PRIMARY CANCER?  Cancer can begin in any organ or tissue of the body. The original tumor is called the primary cancer or primary tumor. It is usually named for the part of the body or the type of cell in which it begins. WHAT IS METASTASIS, AND HOW DOES IT HAPPEN?   Metastasis means the spread of cancer. Cancer cells can break away from a primary tumor and enter the bloodstream or lymphatic system. This is the system that produces, stores, and carries the cells that fight infections. That is how cancer cells spread to other parts of the body.  When cancer cells spread and form a new tumor in a different organ, the new tumor is a metastatic tumor. The cells in the metastatic tumor come  from the original tumor. For example, if breast cancer spreads to the lungs, the metastatic tumor in the lung is made up of cancerous breast cells. It is not made of lung cells. In this case, the disease in the lungs is metastatic breast cancer (not lung cancer). Under a microscope, metastatic breast cancer cells generally look the same as the cancer cells in the breast. [place]?   Cancer cells can spread to almost any part of the body. Cancer cells frequently spread to lymph nodes (rounded masses of lymphatic tissue) near the primary tumor (regional lymph nodes). This is called lymph node involvement or regional disease. Cancer that spreads to other organs or to lymph nodes far from the primary tumor is called metastatic disease. Caregivers sometimes also call this distant disease.  The most common sites of metastasis from solid tumors are the lungs, bones, liver, and brain. Some cancers tend to spread to certain parts of the body. For example, lung cancer often metastasizes to the brain or bones. Colon cancer often spreads to the liver. Prostate cancer tends to spread to the bones. Breast cancer commonly spreads to the bones, lungs, liver, or brain. But each of these cancers can spread to  other parts of the body as well.  Because blood cells travel throughout the body, leukemia, multiple myeloma, and lymphoma cells are usually not localized when the cancer is diagnosed. Tumor cells may be found in the blood, several lymph nodes, or other parts of the body such as the liver or bones. This type of spread is not referred to as metastasis. ARE THERE SYMPTOMS OF METASTATIC CANCER?   Some people with metastatic cancer do not have symptoms. Their metastases are found by X-rays and other tests performed for other reasons.  When symptoms of metastatic cancer occur, the type and frequency of the symptoms will depend on the size and location of the metastasis. For example, cancer that spreads to  the bones is likely to cause pain and can lead to bone fractures. Cancer that spreads to the brain can cause a variety of symptoms. These include headaches, seizures, and unsteadiness. Shortness of breath may be a sign of lung involvement. Abdominal swelling or yellowing of the skin (jaundice) can indicate that cancer has spread to the liver.  Sometimes a person's primary cancer is discovered only after the metastatic tumor causes symptoms. For example, a man whose prostate cancer has spread to the bones in his pelvis may have lower back pain (caused by the cancer in his bones) before he experiences any symptoms from the primary tumor in his prostate. HOW DOES THE CAREGIVER KNOW WHETHER A CANCER IS PRIMARY OR A METASTATIC TUMOR?  To determine whether a tumor is primary or metastatic, the tumor will be examined under a microscope. In general, cancer cells look like abnormal versions of cells in the tissue where the cancer began. Using specialized diagnostic tests, a trained person is often able to tell where the cancer cells came from. Markers or antigens found in or on the cancer cells can indicate the primary site of the cancer.  Metastatic cancers may be found before or at the same time as the primary tumor, or months or years later. When a new tumor is found in a patient who has been treated for cancer in the past, it is more often a metastasis than another primary tumor. IS IT POSSIBLE TO HAVE A METASTATIC TUMOR WITHOUT HAVING A PRIMARY CANCER?  No. A metastatic tumor always starts from cancer cells in another part of the body. In most cases, when a metastatic tumor is found first, the primary tumor can be found. The search for the primary tumor may involve lab tests, X-rays, and other procedures. However, in a small number of cases, a metastatic tumor is diagnosed but the primary tumor cannot be found, in spite of extensive tests. The tumor is metastatic because the cells are not like those in the  organ or tissue in which the tumor is found. The primary tumor is called unknown or hidden (occult). The patient is said to have cancer of unknown primary origin (CUP). Because diagnostic techniques are constantly improving, the number of cases of CUP is going down.  WHAT TREATMENTS ARE USED FOR METASTATIC CANCER?   When cancer has metastasized, it may be treated with:  Chemotherapy.  Radiation therapy.  Biological therapy.  Hormone therapy.  Surgery.  Cryosurgery.  A combination of these.  The choice of treatment generally depends on the:  Type of primary cancer.  Size and location of the metastasis.  Patient's age and general health.  Types of treatments the patient has had in the past. In patients with CUP, it is possible to treat the disease  even though the primary tumor has not been located. The goal of treatment may be to control the cancer, or to relieve symptoms or side effects of treatment. ARE NEW TREATMENTS FOR METASTATIC CANCER BEING DEVELOPED?  Yes, many new cancer treatments are under study. To develop new treatments, the [place] sponsors clinical trials (research studies) with cancer patients in many hospitals, universities, medical schools, and cancer centers around the country. Clinical trials are a critical step in the improvement of treatment. Before any new treatment can be recommended for general use, doctors conduct studies to find out whether the treatment is both safe for patients and effective against the disease. The results of such studies have led to progress not only in the treatment of cancer, but in the detection, diagnosis, and prevention of the disease as well. Patients interested in taking part in a clinical trial should talk with their caregivers. [place] ([place]): www.cancer.gov Document Released: 02/20/2005 Document Revised: 01/08/2012 Document Reviewed: 10/08/2008 [facility] Patient Information 2014 [place],  [place].

## 2014-02-05 NOTE — ED Notes (Addendum)
He was at [facility] for abd pain last night and discharged home. He continues to have abd pain and noticed some blood in his stool today. He has been diagnosed with stomach cancer but has not started any treatment for it yet

## 2014-02-05 NOTE — ED Notes (Signed)
Pt here with continued abdominal pain, N/V and blood diarrhea. Was seen at [facility] last night for same, but since dx states "I have all of this blood in my stool." Pts abdomen taut, tender on palpation to right upper abdomen.

## 2014-02-06 ENCOUNTER — Encounter (HOSPITAL_COMMUNITY): Payer: Self-pay | Admitting: Internal Medicine

## 2014-02-06 ENCOUNTER — Inpatient Hospital Stay (HOSPITAL_COMMUNITY): Payer: Self-pay

## 2014-02-06 DIAGNOSIS — R109 Unspecified abdominal pain: Secondary | ICD-10-CM

## 2014-02-06 DIAGNOSIS — C20 Malignant neoplasm of rectum: Secondary | ICD-10-CM

## 2014-02-06 DIAGNOSIS — C801 Malignant (primary) neoplasm, unspecified: Secondary | ICD-10-CM

## 2014-02-06 DIAGNOSIS — R634 Abnormal weight loss: Secondary | ICD-10-CM

## 2014-02-06 DIAGNOSIS — K625 Hemorrhage of anus and rectum: Secondary | ICD-10-CM | POA: Diagnosis present

## 2014-02-06 DIAGNOSIS — C7800 Secondary malignant neoplasm of unspecified lung: Secondary | ICD-10-CM

## 2014-02-06 LAB — URINALYSIS, ROUTINE W REFLEX MICROSCOPIC
Bilirubin Urine: NEGATIVE
Glucose, UA: NEGATIVE mg/dL
Hgb urine dipstick: NEGATIVE
Ketones, ur: 15 mg/dL — AB
LEUKOCYTES UA: NEGATIVE
NITRITE: NEGATIVE
PROTEIN: NEGATIVE mg/dL
SPECIFIC GRAVITY, URINE: 1.019 (ref 1.005–1.030)
UROBILINOGEN UA: 1 mg/dL (ref 0.0–1.0)
pH: 5 (ref 5.0–8.0)

## 2014-02-06 LAB — CBC
HEMATOCRIT: 27.4 % — AB (ref 39.0–52.0)
HEMATOCRIT: 25.2 % — AB (ref 39.0–52.0)
HEMATOCRIT: 25.7 % — AB (ref 39.0–52.0)
HEMATOCRIT: 25.8 % — AB (ref 39.0–52.0)
HEMOGLOBIN: 9 g/dL — AB (ref 13.0–17.0)
HEMOGLOBIN: 8.5 g/dL — AB (ref 13.0–17.0)
Hemoglobin: 8.5 g/dL — ABNORMAL LOW (ref 13.0–17.0)
Hemoglobin: 8.4 g/dL — ABNORMAL LOW (ref 13.0–17.0)
MCH: 26.9 pg (ref 26.0–34.0)
MCH: 27.2 pg (ref 26.0–34.0)
MCH: 27.6 pg (ref 26.0–34.0)
MCH: 26.8 pg (ref 26.0–34.0)
MCHC: 32.8 g/dL (ref 30.0–36.0)
MCHC: 33.1 g/dL (ref 30.0–36.0)
MCHC: 33.7 g/dL (ref 30.0–36.0)
MCHC: 32.6 g/dL (ref 30.0–36.0)
MCV: 82 fL (ref 78.0–100.0)
MCV: 81.8 fL (ref 78.0–100.0)
MCV: 82.1 fL (ref 78.0–100.0)
MCV: 82.4 fL (ref 78.0–100.0)
PLATELETS: 254 10*3/uL (ref 150–400)
Platelets: 289 10*3/uL (ref 150–400)
Platelets: 283 10*3/uL (ref 150–400)
Platelets: 285 10*3/uL (ref 150–400)
RBC: 3.34 MIL/uL — ABNORMAL LOW (ref 4.22–5.81)
RBC: 3.08 MIL/uL — ABNORMAL LOW (ref 4.22–5.81)
RBC: 3.13 MIL/uL — AB (ref 4.22–5.81)
RBC: 3.13 MIL/uL — ABNORMAL LOW (ref 4.22–5.81)
RDW: 13.5 % (ref 11.5–15.5)
RDW: 13.6 % (ref 11.5–15.5)
RDW: 13.9 % (ref 11.5–15.5)
RDW: 13.7 % (ref 11.5–15.5)
WBC: 6.1 10*3/uL (ref 4.0–10.5)
WBC: 5.4 10*3/uL (ref 4.0–10.5)
WBC: 5.5 10*3/uL (ref 4.0–10.5)
WBC: 6.6 10*3/uL (ref 4.0–10.5)

## 2014-02-06 LAB — GLUCOSE, CAPILLARY
GLUCOSE-CAPILLARY: 100 mg/dL — AB (ref 70–99)
GLUCOSE-CAPILLARY: 114 mg/dL — AB (ref 70–99)
Glucose-Capillary: 102 mg/dL — ABNORMAL HIGH (ref 70–99)
Glucose-Capillary: 129 mg/dL — ABNORMAL HIGH (ref 70–99)
Glucose-Capillary: 110 mg/dL — ABNORMAL HIGH (ref 70–99)
Glucose-Capillary: 106 mg/dL — ABNORMAL HIGH (ref 70–99)

## 2014-02-06 LAB — TYPE AND SCREEN
ABO/RH(D): A POS
ANTIBODY SCREEN: NEGATIVE

## 2014-02-06 LAB — COMPREHENSIVE METABOLIC PANEL
ALBUMIN: 2.9 g/dL — AB (ref 3.5–5.2)
ALT: 85 U/L — ABNORMAL HIGH (ref 0–53)
AST: 104 U/L — ABNORMAL HIGH (ref 0–37)
Alkaline Phosphatase: 271 U/L — ABNORMAL HIGH (ref 39–117)
BUN: 14 mg/dL (ref 6–23)
CALCIUM: 8.9 mg/dL (ref 8.4–10.5)
CO2: 24 mEq/L (ref 19–32)
Chloride: 101 mEq/L (ref 96–112)
Creatinine, Ser: 1.09 mg/dL (ref 0.50–1.35)
GFR calc non Af Amer: 86 mL/min — ABNORMAL LOW (ref >90)
GLUCOSE: 96 mg/dL (ref 70–99)
Potassium: 4 mEq/L (ref 3.7–5.3)
Sodium: 140 mEq/L (ref 137–147)
Total Bilirubin: 1.4 mg/dL — ABNORMAL HIGH (ref 0.3–1.2)
Total Protein: 7 g/dL (ref 6.0–8.3)

## 2014-02-06 MED ORDER — DEXTROSE-NACL 5-0.9 % IV SOLN
INTRAVENOUS | Status: AC
  Administered 2014-02-06 (×2): via INTRAVENOUS

## 2014-02-06 MED ORDER — IOHEXOL 300 MG/ML  SOLN
100.0000 mL | Freq: Once | INTRAMUSCULAR | Status: AC | PRN
  Administered 2014-02-06: 100 mL via INTRAVENOUS

## 2014-02-06 MED ORDER — GI COCKTAIL ~~LOC~~
30.0000 mL | Freq: Three times a day (TID) | ORAL | Status: DC | PRN
  Administered 2014-02-06 – 2014-02-09 (×8): 30 mL via ORAL
  Filled 2014-02-06 (×9): qty 30

## 2014-02-06 MED ORDER — ACETAMINOPHEN 325 MG PO TABS
650.0000 mg | ORAL_TABLET | Freq: Four times a day (QID) | ORAL | Status: DC | PRN
Start: 2014-02-06 — End: 2014-02-09
  Administered 2014-02-09: 650 mg via ORAL
  Filled 2014-02-06: qty 2

## 2014-02-06 MED ORDER — HYDROMORPHONE HCL PF 1 MG/ML IJ SOLN
1.0000 mg | INTRAMUSCULAR | Status: DC | PRN
  Administered 2014-02-06 – 2014-02-09 (×14): 1 mg via INTRAVENOUS
  Filled 2014-02-06 (×14): qty 1

## 2014-02-06 MED ORDER — IOHEXOL 300 MG/ML  SOLN
25.0000 mL | Freq: Once | INTRAMUSCULAR | Status: AC | PRN
  Administered 2014-02-06: 25 mL via ORAL

## 2014-02-06 MED ORDER — ONDANSETRON HCL 4 MG/2ML IJ SOLN
4.0000 mg | Freq: Four times a day (QID) | INTRAMUSCULAR | Status: DC | PRN
  Administered 2014-02-06 – 2014-02-09 (×4): 4 mg via INTRAVENOUS
  Filled 2014-02-06 (×4): qty 2

## 2014-02-06 MED ORDER — ONDANSETRON HCL 4 MG PO TABS: 4.0000 mg | ORAL_TABLET | Freq: Four times a day (QID) | ORAL | Status: DC | PRN

## 2014-02-06 MED ORDER — ACETAMINOPHEN 650 MG RE SUPP: 650.0000 mg | Freq: Four times a day (QID) | RECTAL | Status: DC | PRN

## 2014-02-06 MED ORDER — PANTOPRAZOLE SODIUM 40 MG IV SOLR
40.0000 mg | Freq: Two times a day (BID) | INTRAVENOUS | Status: DC
  Administered 2014-02-06 – 2014-02-07 (×4): 40 mg via INTRAVENOUS
  Filled 2014-02-06 (×6): qty 40

## 2014-02-06 NOTE — ED Notes (Signed)
Pt transported to CT ?

## 2014-02-06 NOTE — H&P (Signed)
[facility] History and Physical  Andrew Gomez SWN:[id number] DOB: [birth year] DOA: 02/05/2014  Referring physician: ER physician. PCP: No PCP Per Patient   Chief Complaint: Abdominal pain and bleeding per rectum.  HPI: Andrew Gomez is a 38 y.o. male who was recently admitted to the hospital and was diagnosed with metastatic rectal carcinoma presents to the ER because of increasing abdominal pain with bright blood per rectum. Patient had come to the ER a day ago with abdominal pain was discharged home on oral Dilaudid which patient was not able to get. Patient has been having nausea and vomiting also in addition to bleeding per rectum. Pain is mostly in the epigastric and right upper quadrant. In the ER patient's hemoglobin appeared to be stable when compared to recent. Abdominal x-ray does not show anything acute. Patient is to follow with Dr. Beverely Pace but since [facility] further plans are [facility]. Presently patient has been admitted for further management for his bleeding and abdominal pain.    Review of Systems: As presented in the history of presenting illness, rest negative.  Past Medical History  Diagnosis Date  . Hypertension   . 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 GERD (gastroesophageal reflux disease)    Past Surgical History  Procedure Laterality Date  . Liver biopsy  01/30/2014   Social History:  reports that he has never smoked. He has never used smokeless tobacco. He reports that he does not drink alcohol or use illicit drugs. Where does patient lives half way home. Can patient participate in ADLs? Yes.  No Known Allergies  Family History:  Family History  Problem Relation Age of Onset  . Family history unknown: Yes      Prior to Admission medications   Medication Sig Start Date End Date Taking? Authorizing Provider  alum & mag hydroxide-simeth (MAALOX ADVANCED) 200-200-20 MG/5ML  suspension Take 30 mLs by mouth every 6 (six) hours as needed for indigestion or heartburn. 02/05/14  Yes Kalman Drape, MD  dicyclomine (BENTYL) 10 MG capsule Take 10 mg by mouth 3 (three) times daily before meals.   Yes Historical Provider, MD  docusate sodium (COLACE) 100 MG capsule Take 100 mg by mouth 2 (two) times daily.   Yes Historical Provider, MD  HYDROmorphone (DILAUDID) 2 MG tablet Take 1 tablet (2 mg total) by mouth every 4 (four) hours as needed for severe pain. 02/05/14  Yes Kalman Drape, MD  ibuprofen (ADVIL,MOTRIN) 600 MG tablet Take 1 tablet (600 mg total) by mouth every 6 (six) hours as needed for headache. 02/02/14  Yes Nishant Dhungel, MD  metoCLOPramide (REGLAN) 5 MG tablet Take 1 tablet (5 mg total) by mouth 3 (three) times daily before meals. 02/02/14  Yes Nishant Dhungel, MD  ondansetron (ZOFRAN ODT) 8 MG disintegrating tablet Take 1 tablet (8 mg total) by mouth every 8 (eight) hours as needed for nausea or vomiting. 02/05/14  Yes Kalman Drape, MD  pantoprazole (PROTONIX) 40 MG tablet Take 1 tablet (40 mg total) by mouth daily. 02/02/14  Yes Nishant Dhungel, MD  polyethylene glycol (MIRALAX / GLYCOLAX) packet Take 17 g by mouth daily. 02/02/14  Yes Nishant Dhungel, MD  traMADol (ULTRAM) 50 MG tablet Take 1 tablet (50 mg total) by mouth every 6 (six) hours as needed for moderate pain. 02/02/14  Yes Louellen Molder, MD    Physical Exam: Filed Vitals:   02/05/14 2115 02/05/14 2130 02/05/14 2318  02/06/14 0000  BP: 139/89 141/93 138/90 125/87  Pulse: 100 102 112 99  Temp:      TempSrc:      Resp:   20   Height:      Weight:      SpO2: 99% 99% 96% 95%     General:  Well-developed and moderately nourished.  Eyes: Anicteric mild pallor present.  ENT: No discharge from the ears eyes nose mouth.  Neck: No mass felt.  Cardiovascular: S1-S2 heard.  Respiratory: No rhonchi or crepitations.  Abdomen: Soft mild epigastric tenderness no guarding or rigidity.  Skin:  Pallor.  Musculoskeletal: No edema.  Psychiatric: Appears normal.  Neurologic: Alert awake oriented to time place and person. Moves all extremities.  Labs on Admission:  Basic Metabolic Panel:  Recent Labs Lab 01/30/14 1044 01/31/14 0418 02/05/14 0005 02/05/14 1751  NA 144 140 137 143  K 4.0 4.3 4.0 4.1  CL 102 97 97 103  CO2 26 24 24 23   GLUCOSE 95 96 96 100*  BUN 15 15 19 18   CREATININE 1.04 1.06 1.23 1.19  CALCIUM 9.4 9.7 9.5 9.3   Liver Function Tests:  Recent Labs Lab 01/30/14 1044 02/05/14 0005 02/05/14 1751  AST 95* 95* 99*  ALT 83* 94* 87*  ALKPHOS 313* 292* 269*  BILITOT 1.5* 1.4* 1.3*  PROT 7.8 7.6 7.2  ALBUMIN 3.4* 3.1* 3.0*    Recent Labs Lab 01/30/14 1044 02/05/14 0005 02/05/14 2140  LIPASE 26 34 32   No results found for this basename: AMMONIA,  in the last 168 hours CBC:  Recent Labs Lab 01/30/14 1044 01/31/14 0418 02/05/14 0005 02/05/14 1751  WBC 6.5 8.1 6.1 5.7  NEUTROABS  --   --  4.5 4.1  HGB 9.8* 9.8* 8.8* 8.7*  HCT 29.3* 29.8* 26.6* 26.0*  MCV 82.5 82.8 81.3 81.5  PLT 305 321 322 289   Cardiac Enzymes:  Recent Labs Lab 02/05/14 2140  TROPONINI <0.30    BNP (last 3 results) No results found for this basename: PROBNP,  in the last 8760 hours CBG: No results found for this basename: GLUCAP,  in the last 168 hours  Radiological Exams on Admission: Dg Abd Acute W/chest  02/05/2014   CLINICAL DATA:  Abdominal pain and distension  EXAM: ACUTE ABDOMEN SERIES (ABDOMEN 2 VIEW & CHEST 1 VIEW)  COMPARISON:  Prior study from 01/31/2014  FINDINGS: The cardiac and mediastinal silhouettes are stable in size and contour, and remain within normal limits.  The lungs are normally inflated. No airspace consolidation, pleural effusion, or pulmonary edema is identified. There is no pneumothorax.  Scattered loops of gas-filled nondilated bowel are seen within the lower abdomen and pelvis. No evidence of obstruction or ileus. No free  intraperitoneal air. No soft tissue mass or abnormal calcification.  No acute osseous abnormality identified.  IMPRESSION: 1. Nonobstructive bowel gas pattern with no radiographic evidence of acute intra-abdominal process. 2. No acute cardiopulmonary process identified.   Electronically Signed   By: Jeannine Boga M.D.   On: 02/05/2014 22:06    Assessment/Plan Principal Problem:   Abdominal pain Active Problems:   Rectal cancer   Metastatic cancer   Rectal bleeding   1. Abdominal pain most likely secondary to metastasis - patient's pain is significant and an abdomen is tender on exam. CT abdomen and pelvis has been ordered. Patient will be kept n.p.o. Continue with Dilaudid for pain relief. 2. Rectal bleeding - probably from cancer. Repeat CBC has been ordered.  Type and screen. Transfuse if there is any further fall in hemoglobin. Protonix IV. Closely follow CBC. 3. Anemia secondary to acute blood loss - follow CBC. 4. Metastatic rectal cancer - consult Dr. Beverely Pace in a.m.    Code Status: Full code.  Family Communication: None.  Disposition Plan: Admit to inpatient.    [facility] Pager [phone].  If 7PM-7AM, please contact night-coverage [email or web address] Password [email or web address] 02/06/2014, 12:45 AM

## 2014-02-06 NOTE — Progress Notes (Signed)
Followup: Patient admitted earlier this morning. Seen after arrival to floor  Abdominal CT noted signs consistent with previous rectal carcinoma. No signs of obstruction. Pain and discomfort sounds like discomfort from Reglan with increased pressure on rectosigmoid area. In addition may be having some gastric inflammation secondary to indomethacin. Review test results and given signs consistent with metastatic colon cancer to lung and liver and possibly even bone given elevated alkaline phosphatase, I'm inclined to start him on stronger pain medication plus GI cocktail. Observe overnight and discharge tomorrow.

## 2014-02-06 NOTE — ED Notes (Signed)
Dr K at bedside.

## 2014-02-07 DIAGNOSIS — R509 Fever, unspecified: Secondary | ICD-10-CM | POA: Diagnosis not present

## 2014-02-07 MED ORDER — DOCUSATE SODIUM 100 MG PO CAPS
100.0000 mg | ORAL_CAPSULE | Freq: Two times a day (BID) | ORAL | Status: DC
  Administered 2014-02-07 – 2014-02-09 (×5): 100 mg via ORAL
  Filled 2014-02-07 (×5): qty 1

## 2014-02-07 MED ORDER — POLYETHYLENE GLYCOL 3350 17 G PO PACK
17.0000 g | PACK | Freq: Every day | ORAL | Status: DC
  Administered 2014-02-07 – 2014-02-09 (×3): 17 g via ORAL
  Filled 2014-02-07 (×3): qty 1

## 2014-02-07 MED ORDER — PANTOPRAZOLE SODIUM 40 MG PO TBEC
40.0000 mg | DELAYED_RELEASE_TABLET | Freq: Two times a day (BID) | ORAL | Status: DC
  Administered 2014-02-07 – 2014-02-09 (×4): 40 mg via ORAL
  Filled 2014-02-07 (×4): qty 1

## 2014-02-07 MED ORDER — METRONIDAZOLE 500 MG PO TABS
500.0000 mg | ORAL_TABLET | Freq: Three times a day (TID) | ORAL | Status: DC
  Administered 2014-02-07 – 2014-02-09 (×6): 500 mg via ORAL
  Filled 2014-02-07 (×10): qty 1

## 2014-02-07 MED ORDER — CIPROFLOXACIN HCL 500 MG PO TABS
500.0000 mg | ORAL_TABLET | Freq: Two times a day (BID) | ORAL | Status: DC
  Administered 2014-02-07 – 2014-02-09 (×4): 500 mg via ORAL
  Filled 2014-02-07 (×6): qty 1

## 2014-02-07 NOTE — Progress Notes (Signed)
[facility] PROGRESS NOTE  Andrew Gomez TGG:[id number] DOB: [birth year] DOA: 02/05/2014 PCP: No PCP Per Patient  Assessment/Plan: Principal Problem:   Fever, unspecified: Urine and chest x-ray unremarkable. Suspect he likely has bacterial translocation of the gut from some retained stool. Started by mouth Cipro and Flagyl as well as MiraLAX. No bowel movement after several hours, will try stronger laxative Active Problems:   Rectal cancer   Unspecified constipation: Secondary to rectal carcinoma plus pain medications   Metastatic colorectal cancer: Patient will l need to start chemotherapy.  Check GGTP given elevated alkaline phosphatase   Abdominal pain: Likely secondary to decreased bowel transition    Code Status: Full code  Family Communication: Close friend at the bedside.  Disposition Plan: Given fever, will observe on antibiotics, likely discharge tomorrow   Consultants:  None  Procedures:  None  Antibiotics:  By mouth Cipro and Flagyl started 4/11  HPI/Subjective: Patient feeling a little bit better today. Still some abdominal discomfort. No bowel movement times several days  Objective: Filed Vitals:   02/07/14 1328  BP: 141/89  Pulse: 103  Temp: 99.4 F (37.4 C)  Resp: 16    Intake/Output Summary (Last 24 hours) at 02/07/14 1725 Last data filed at 02/07/14 0616  Gross per 24 hour  Intake    340 ml  Output      0 ml  Net    340 ml   Filed Weights   02/05/14 1747 02/06/14 0213  Weight: 72.757 kg (160 lb 6.4 oz) 72.576 kg (160 lb)    Exam:   General:  Alert and oriented x3, minimal distress secondary to abdominal distention and pain  Cardiovascular: Regular rate and rhythm, S1-S2, borderline tachycardia  Respiratory: Clear auscultation bilaterally  Abdomen: Soft, distended, mild generalized nonspecific tenderness, hypoactive bowel sounds  Musculoskeletal: No clubbing or cyanosis or edema   Data Reviewed: Basic Metabolic  Panel:  Recent Labs Lab 02/05/14 0005 02/05/14 1751 02/06/14 0445  NA 137 143 140  K 4.0 4.1 4.0  CL 97 103 101  CO2 24 23 24   GLUCOSE 96 100* 96  BUN 19 18 14   CREATININE 1.23 1.19 1.09  CALCIUM 9.5 9.3 8.9   Liver Function Tests:  Recent Labs Lab 02/05/14 0005 02/05/14 1751 02/06/14 0445  AST 95* 99* 104*  ALT 94* 87* 85*  ALKPHOS 292* 269* 271*  BILITOT 1.4* 1.3* 1.4*  PROT 7.6 7.2 7.0  ALBUMIN 3.1* 3.0* 2.9*    Recent Labs Lab 02/05/14 0005 02/05/14 2140  LIPASE 34 32   No results found for this basename: AMMONIA,  in the last 168 hours CBC:  Recent Labs Lab 02/05/14 0005 02/05/14 1751 02/06/14 0130 02/06/14 0445 02/06/14 1630  WBC 6.1 5.7 6.1 5.4  5.5 6.6  NEUTROABS 4.5 4.1  --   --   --   HGB 8.8* 8.7* 9.0* 8.5*  8.5* 8.4*  HCT 26.6* 26.0* 27.4* 25.7*  25.2* 25.8*  MCV 81.3 81.5 82.0 82.1  81.8 82.4  PLT 322 289 289 285  283 254   Cardiac Enzymes:  Recent Labs Lab 02/05/14 2140  TROPONINI <0.30   BNP (last 3 results) No results found for this basename: PROBNP,  in the last 8760 hours CBG:  Recent Labs Lab 02/06/14 0744 02/06/14 1140 02/06/14 1552 02/06/14 2037 02/06/14 2333  GLUCAP 100* 129* 110* 106* 114*    Recent Results (from the past 240 hour(s))  URINE CULTURE     Status: None   Collection Time  01/31/14  1:36 PM      Result Value Ref Range Status   Specimen Description URINE, CLEAN CATCH   Final   Special Requests NONE   Final   Culture  Setup Time     Final   Value: 01/31/2014 21:44     Performed at [facility]     Final   Value: 3,000 COLONIES/ML     Performed at [facility]   Culture     Final   Value: INSIGNIFICANT GROWTH     Performed at [facility]   Report Status 02/01/2014 FINAL   Final     Studies: Ct Abdomen Pelvis W Contrast  02/06/2014   CLINICAL DATA:  Abdominal pain. Recent diagnosis of metastatic rectal carcinoma.  EXAM: CT ABDOMEN AND PELVIS  WITH CONTRAST  TECHNIQUE: Multidetector CT imaging of the abdomen and pelvis was performed using the standard protocol following bolus administration of intravenous contrast.  CONTRAST:  141mL OMNIPAQUE IOHEXOL 300 MG/ML  SOLN  COMPARISON:  01/30/2014  FINDINGS: Lung bases demonstrate numerous small bilateral pulmonary nodules unchanged likely metastatic disease.  Abdominal images demonstrate numerous peripherally enhancing hypodense masses throughout the liver without significant change compatible with metastatic disease. The spleen, pancreas, gallbladder and adrenal glands are within normal. Kidneys are normal in size without hydronephrosis or nephrolithiasis. The appendix is normal. There is mild fecal retention throughout the colon. The low density 1 cm lymph node posterior to the aortic bifurcation unchanged.  Pelvic images again demonstrate focal masslike wall thickening over the rectum without significant change likely representing patient's known neoplasm. There are small adjacent perirectal and right pelvic sidewall lymph nodes unchanged. There is no evidence of perforation. There is a normal amount of fluid in the presacral space and right anterior pelvis. Remaining bony structures are within normal.  IMPRESSION: No acute findings in the abdomen/pelvis.  Mass like wall thickening over the rectum unchanged likely representing patient's known rectal carcinoma. Small adjacent perirectal and right pelvic sidewall lymph nodes unchanged. Severe metastatic involvement of the liver as well as pulmonary metastatic disease unchanged. 1 cm low-density lymph node posterior to the aortic bifurcation unchanged.  Minimal free fluid over the presacral space and anterior right pelvis.   Electronically Signed   By: Marin Olp M.D.   On: 02/06/2014 02:08   Dg Abd Acute W/chest  02/05/2014   CLINICAL DATA:  Abdominal pain and distension  EXAM: ACUTE ABDOMEN SERIES (ABDOMEN 2 VIEW & CHEST 1 VIEW)  COMPARISON:  Prior study  from 01/31/2014  FINDINGS: The cardiac and mediastinal silhouettes are stable in size and contour, and remain within normal limits.  The lungs are normally inflated. No airspace consolidation, pleural effusion, or pulmonary edema is identified. There is no pneumothorax.  Scattered loops of gas-filled nondilated bowel are seen within the lower abdomen and pelvis. No evidence of obstruction or ileus. No free intraperitoneal air. No soft tissue mass or abnormal calcification.  No acute osseous abnormality identified.  IMPRESSION: 1. Nonobstructive bowel gas pattern with no radiographic evidence of acute intra-abdominal process. 2. No acute cardiopulmonary process identified.   Electronically Signed   By: Jeannine Boga M.D.   On: 02/05/2014 22:06    Scheduled Meds: . ciprofloxacin  500 mg Oral BID  . docusate sodium  100 mg Oral BID  . metroNIDAZOLE  500 mg Oral 3 times per day  . pantoprazole  40 mg Oral BID  . polyethylene glycol  17 g Oral  Daily   Continuous Infusions:   Principal Problem:   Fever, unspecified Active Problems:   Rectal cancer   Unspecified constipation   Metastatic cancer   Abdominal pain   Rectal bleeding    Time spent: 25 minutes    [facility] Pager [phone]. If 7PM-7AM, please contact night-coverage at [email or web address], password [email or web address] 02/07/2014, 5:25 PM  LOS: 2 days

## 2014-02-08 DIAGNOSIS — K5900 Constipation, unspecified: Principal | ICD-10-CM

## 2014-02-08 LAB — COMPREHENSIVE METABOLIC PANEL
ALK PHOS: 294 U/L — AB (ref 39–117)
ALT: 85 U/L — AB (ref 0–53)
AST: 114 U/L — ABNORMAL HIGH (ref 0–37)
Albumin: 3.2 g/dL — ABNORMAL LOW (ref 3.5–5.2)
BILIRUBIN TOTAL: 1.5 mg/dL — AB (ref 0.3–1.2)
BUN: 15 mg/dL (ref 6–23)
CO2: 22 mEq/L (ref 19–32)
Calcium: 9.3 mg/dL (ref 8.4–10.5)
Chloride: 99 mEq/L (ref 96–112)
Creatinine, Ser: 0.89 mg/dL (ref 0.50–1.35)
GFR calc non Af Amer: >90 mL/min (ref >90)
GLUCOSE: 90 mg/dL (ref 70–99)
POTASSIUM: 3.9 meq/L (ref 3.7–5.3)
Sodium: 141 mEq/L (ref 137–147)
TOTAL PROTEIN: 7.4 g/dL (ref 6.0–8.3)

## 2014-02-08 LAB — CBC
HEMATOCRIT: 27.1 % — AB (ref 39.0–52.0)
HEMOGLOBIN: 8.9 g/dL — AB (ref 13.0–17.0)
MCH: 27.1 pg (ref 26.0–34.0)
MCHC: 32.8 g/dL (ref 30.0–36.0)
MCV: 82.4 fL (ref 78.0–100.0)
Platelets: 269 10*3/uL (ref 150–400)
RBC: 3.29 MIL/uL — AB (ref 4.22–5.81)
RDW: 13.7 % (ref 11.5–15.5)
WBC: 7.7 10*3/uL (ref 4.0–10.5)

## 2014-02-08 LAB — GAMMA GT: GGT: 272 U/L — ABNORMAL HIGH (ref 7–51)

## 2014-02-08 MED ORDER — HYDROMORPHONE HCL 2 MG PO TABS: 2.0000 mg | ORAL_TABLET | ORAL | Status: AC | PRN

## 2014-02-08 MED ORDER — FLEET ENEMA 7-19 GM/118ML RE ENEM
1.0000 | ENEMA | Freq: Once | RECTAL | Status: AC
  Administered 2014-02-08: 14:00:00 via RECTAL
  Filled 2014-02-08: qty 1

## 2014-02-08 MED ORDER — CIPROFLOXACIN HCL 500 MG PO TABS: 500.0000 mg | ORAL_TABLET | Freq: Two times a day (BID) | ORAL | Status: AC

## 2014-02-08 MED ORDER — METRONIDAZOLE 500 MG PO TABS: 500.0000 mg | ORAL_TABLET | Freq: Three times a day (TID) | ORAL | Status: AC

## 2014-02-08 NOTE — Progress Notes (Signed)
Patient expressed concern that he will not be able to get any pain medication from [facility] if he is discharged tonight, patient requests that he be allowed to DC in morning so he can have pain  Medication to take.  Spoke with Dr. [place] Pink regarding this who okayed patient staying overnight but that patient was to be discharged first thing in the morning, will not be seen by physician tomorrow.  Patient has discharge papers in room and Dilaudid prescription, other scripts sent to [facility].

## 2014-02-08 NOTE — Discharge Instructions (Signed)
Colorectal Cancer Colorectal cancer is an abnormal growth of tissue (tumor) in the colon or rectum that is cancerous (malignant). Unlike noncancerous (benign) tumors, malignant tumors can spread to other parts of your body. The colon is the large bowel or large intestine. The rectum is the last several inches of the colon.  RISK FACTORS The exact cause of colorectal cancer is unknown. However, the following factors may increase your chances of getting colorectal cancer:   Age older than 50 years.   Abnormal growths (polyps) on the inner wall of the colon or rectum.   Diabetes.   African American race.   Family history of hereditary nonpolyposis colorectal cancer. This condition is caused by changes in the genes that are responsible for repairing mismatched DNA.   Personal history of cancer. A person who has already had colorectal cancer may develop it a second time. Also, women with a history of ovarian, uterine, or breast cancer are at a somewhat higher risk of developing colorectal cancer.  Certain hereditary conditions.  Eating a diet that is high in fat (especially animal fat) and low in fiber, fruits, and vegetables.  Sedentary lifestyle.  Inflammatory bowel disease, including ulcerative colitis and Crohn disease.   Smoking.   Excessive alcohol use.  SYMPTOMS Early colorectal cancer often does not cause symptoms. As the cancer grows, symptoms may include:   Changes in bowel habits.  Diarrhea.   Constipation.   Feeling like the bowel does not empty completely after a bowel movement.   Blood in the stool.   Stools that are narrower than usual.   Abdominal discomfort, pain, bloating, fullness, or cramps.  Frequent gas pain.   Unexplained weight loss.   Constant tiredness.   Nausea and vomiting.  DIAGNOSIS  Your health care provider will ask about your medical history. He or she may also perform a number of procedures, such as:   A physical  exam.  A digital rectal exam.  A fecal occult blood test.  A barium enema.  Blood tests.   X-rays.   Imaging tests, such as CT scans or MRIs.   Taking a tissue sample (biopsy) from your colon or rectum to look for cancer cells.   A sigmoidoscopy to view the inside of the last part of your colon.   A colonoscopy to view the inside of your entire colon.   An endorectal ultrasound to see how deep a rectal tumor has grown and whether the cancer has spread to lymph nodes or other nearby tissues.  Your cancer will be staged to determine its severity and extent. Staging is a careful attempt to find out the size of the tumor, whether the cancer has spread, and if so, to what parts of the body. You may need to have more tests to determine the stage of your cancer. The test results will help determine what treatment plan is best for you.   Stage 0 The cancer is found only in the innermost lining of the colon or rectum.   Stage I The cancer has grown into the inner wall of the colon or rectum. The cancer has not yet reached the outer wall of the colon.   Stage II The cancer extends more deeply into or through the wall of the colon or rectum. It may have invaded nearby tissue, but cancer cells have not spread to the lymph nodes.   Stage III The cancer has spread to nearby lymph nodes but not to other parts of the body.     Stage IV The cancer has spread to other parts of the body, such as the liver or lungs.  Your health care provider may tell you the detailed stage of your cancer, which includes both a number and a letter.  TREATMENT  Depending on the type and stage, colorectal cancer may be treated with surgery, radiation therapy, chemotherapy, targeted therapy, or radiofrequency ablation. Some people have a combination of these therapies. Surgery may be done to remove the polyps from your colon. In early stages, your health care provider may be able to do this during a  colonoscopy. In later stages, surgery may be done to remove part of your colon.  HOME CARE INSTRUCTIONS   Only take over-the-counter or prescription medicines for pain, discomfort, or fever as directed by your health care provider.   Maintain a healthy diet.   Consider joining a support group. This may help you learn to cope with the stress of having colorectal cancer.   Seek advice to help you manage treatment of side effects.   Keep all follow-up appointments as directed by your health care provider.   Inform your cancer specialist if you are admitted to the hospital.  SEEK MEDICAL CARE IF:  Your diarrhea or constipation does not go away.   Your bowel habits change.  You have increased abdominal pain.   You notice new fatigue or weakness.  You lose weight. Document Released: 10/16/2005 Document Revised: 06/18/2013 Document Reviewed: 04/10/2013 [employer] Patient Information 2014 [employer].  

## 2014-02-08 NOTE — Discharge Summary (Signed)
Physician Discharge Summary  Andrew Gomez PXT:[id number] DOB: May 06, 1976 DOA: 02/05/2014  PCP: No PCP Per Patient  Admit date: 02/05/2014 Discharge date: 02/08/2014  Time spent: 35 minutes  Recommendations for Outpatient Follow-up:  1. New medications: Cipro 500 twice a day and Flagyl 500 every 8 hours x2 more days for total of 3 days 2. Patient advised discontinued NSAIDs and Reglan 3. Resuming Dilaudid 2 mg by mouth every 4 hours when necessary for pain 4. Patient will follow up with oncology in regards to starting chemotherapy  Discharge Diagnoses:  Principal Problem:   Fever, unspecified Active Problems:   Rectal cancer   Unspecified constipation   Metastatic cancer   Abdominal pain   Rectal bleeding   Discharge Condition: Improved, being discharged halfway house  Diet recommendation: Regular  Filed Weights   02/05/14 1747 02/06/14 0213  Weight: 72.757 kg (160 lb 6.4 oz) 72.576 kg (160 lb)    History of present illness:  38 year old African American male recently discharged from prison diagnosed also recently with metastatic colon cancer presents with complaints of generalized nonspecific abdominal pain on 4/9 evening.  He has not been having much in terms of bowel movements.  Hospital Course:  Principal Problem:   Fever, unspecified: Patient missed the hospital service. Observed overnight and the following day he, patient was noted to have low-grade temperatures 100.9. Urine and chest x-ray were negative. In the findings were some mild to moderate retained stool. No signs of obstruction. Patient started on Cipro and Flagyl and by day of discharge, no fevers at all x24 hours. Patient did not respond to Maalox, but was given a fleets enema which he had some significant results. He'll be discharged on 2 more days of Cipro and Flagyl for total of 3 days of therapy Active Problems:   Rectal cancer: Diagnosis confirms metastatic colorectal cancer to lung and liver. Suspect also  to bone given elevated alkaline phosphatase levels. Patient to followup with oncology to start chemotherapy.    Unspecified constipation: See above.    Metastatic cancer: See above    Abdominal pain: Pain felt to be secondary to retained stool and she can to presentation. No signs of obstruction   Procedures:  None  Consultations:  None  Discharge Exam: Filed Vitals:   02/08/14 0535  BP: 138/88  Pulse: 102  Temp: 98.2 F (36.8 C)  Resp: 19    General: Alert and oriented x3, mild anxiety Cardiovascular: Regular rhythm, borderline tachycardia Respiratory: Clear to auscultation bilaterally Abdomen: Soft, distention, minimal tenderness, hypoactive bowel sounds  Discharge Instructions You were cared for by a hospitalist during your hospital stay. If you have any questions about your discharge medications or the care you received while you were in the hospital after you are discharged, you can call the unit and asked to speak with the hospitalist on call if the hospitalist that took care of you is not available. Once you are discharged, your primary care physician will handle any further medical issues. Please note that NO REFILLS for any discharge medications will be authorized once you are discharged, as it is imperative that you return to your primary care physician (or establish a relationship with a primary care physician if you do not have one) for your aftercare needs so that they can reassess your need for medications and monitor your lab values.  Discharge Orders   Future Orders Complete By Expires   Diet - low sodium heart healthy  As directed    Increase activity slowly  As directed        Medication List    STOP taking these medications       dicyclomine 10 MG capsule  Commonly known as:  BENTYL     ibuprofen 600 MG tablet  Commonly known as:  ADVIL,MOTRIN     metoCLOPramide 5 MG tablet  Commonly known as:  REGLAN      TAKE these medications       alum  & mag hydroxide-simeth 200-200-20 MG/5ML suspension  Commonly known as:  MAALOX ADVANCED  Take 30 mLs by mouth every 6 (six) hours as needed for indigestion or heartburn.     ciprofloxacin 500 MG tablet  Commonly known as:  CIPRO  Take 1 tablet (500 mg total) by mouth 2 (two) times daily.     docusate sodium 100 MG capsule  Commonly known as:  COLACE  Take 100 mg by mouth 2 (two) times daily.     HYDROmorphone 2 MG tablet  Commonly known as:  DILAUDID  Take 1 tablet (2 mg total) by mouth every 4 (four) hours as needed for severe pain.     metroNIDAZOLE 500 MG tablet  Commonly known as:  FLAGYL  Take 1 tablet (500 mg total) by mouth every 8 (eight) hours.     ondansetron 8 MG disintegrating tablet  Commonly known as:  ZOFRAN ODT  Take 1 tablet (8 mg total) by mouth every 8 (eight) hours as needed for nausea or vomiting.     pantoprazole 40 MG tablet  Commonly known as:  PROTONIX  Take 1 tablet (40 mg total) by mouth daily.     polyethylene glycol packet  Commonly known as:  MIRALAX / GLYCOLAX  Take 17 g by mouth daily.     traMADol 50 MG tablet  Commonly known as:  ULTRAM  Take 1 tablet (50 mg total) by mouth every 6 (six) hours as needed for moderate pain.       No Known Allergies    The results of significant diagnostics from this hospitalization (including imaging, microbiology, ancillary and laboratory) are listed below for reference.    Significant Diagnostic Studies: Dg Chest 2 View  01/31/2014   CLINICAL DATA:  Hypertension, history of rectal carcinoma  EXAM: CHEST  2 VIEW  COMPARISON:  01/30/2014  FINDINGS: Cardiac shadow is within normal limits. The lungs are well aerated bilaterally. The patient's known no pulmonary nodules are not well appreciated on this exam due to a size. No bony abnormality is seen.  IMPRESSION: No active cardiopulmonary disease.   Electronically Signed   By: Inez Catalina M.D.   On: 01/31/2014 15:41   Dg Chest 2 View  01/30/2014   CLINICAL  DATA:  Abdominal pain  EXAM: CHEST - 2 VIEW  COMPARISON:  None available  FINDINGS: Lungs are clear. Heart size and mediastinal contours are within normal limits. No effusion. Visualized skeletal structures are unremarkable.  IMPRESSION: No acute cardiopulmonary disease.   Electronically Signed   By: Arne Cleveland M.D.   On: 01/30/2014 11:36   Ct Abdomen Pelvis W Contrast  02/06/2014   CLINICAL DATA:  Abdominal pain. Recent diagnosis of metastatic rectal carcinoma.  EXAM: CT ABDOMEN AND PELVIS WITH CONTRAST  TECHNIQUE: Multidetector CT imaging of the abdomen and pelvis was performed using the standard protocol following bolus administration of intravenous contrast.  CONTRAST:  11mL OMNIPAQUE IOHEXOL 300 MG/ML  SOLN  COMPARISON:  01/30/2014  FINDINGS: Lung bases demonstrate numerous small bilateral pulmonary nodules unchanged likely  metastatic disease.  Abdominal images demonstrate numerous peripherally enhancing hypodense masses throughout the liver without significant change compatible with metastatic disease. The spleen, pancreas, gallbladder and adrenal glands are within normal. Kidneys are normal in size without hydronephrosis or nephrolithiasis. The appendix is normal. There is mild fecal retention throughout the colon. The low density 1 cm lymph node posterior to the aortic bifurcation unchanged.  Pelvic images again demonstrate focal masslike wall thickening over the rectum without significant change likely representing patient's known neoplasm. There are small adjacent perirectal and right pelvic sidewall lymph nodes unchanged. There is no evidence of perforation. There is a normal amount of fluid in the presacral space and right anterior pelvis. Remaining bony structures are within normal.  IMPRESSION: No acute findings in the abdomen/pelvis.  Mass like wall thickening over the rectum unchanged likely representing patient's known rectal carcinoma. Small adjacent perirectal and right pelvic sidewall  lymph nodes unchanged. Severe metastatic involvement of the liver as well as pulmonary metastatic disease unchanged. 1 cm low-density lymph node posterior to the aortic bifurcation unchanged.  Minimal free fluid over the presacral space and anterior right pelvis.   Electronically Signed   By: Marin Olp M.D.   On: 02/06/2014 02:08   Ct Abdomen Pelvis W Contrast  01/30/2014   CLINICAL DATA:  Blood in stools and diffuse abdominal pain.  EXAM: CT ABDOMEN AND PELVIS WITH CONTRAST  TECHNIQUE: Multidetector CT imaging of the abdomen and pelvis was performed using the standard protocol following bolus administration of intravenous contrast.  CONTRAST:  171mL OMNIPAQUE IOHEXOL 300 MG/ML  SOLN  COMPARISON:  None.  FINDINGS: The lung bases demonstrate small bilateral pulmonary nodules suspicious for metastatic disease. The heart is normal in size. No pericardial effusion.  The liver is markedly enlarged and diffusely infiltrated with tumor. There are head numerous lesions and both lobes of liver consistent with metastatic disease. The largest lesion in the left lobe on image number 12 measures 11 x 10 cm. An indexed lesion in the right lobe on image number 33 measures 7.4 x 6.7 cm. The gallbladder is not well visualized and likely compressed. No common bile duct dilatation. The pancreas appears normal. The spleen is normal in size. No focal lesions. The portal and splenic veins are patent. The adrenal glands and kidneys are unremarkable. There is mild compression of the right kidney by the enlarged right hepatic lobe.  The stomach, duodenum and small bowel are unremarkable. No mass lesions or obstructive findings. The colon demonstrates moderate stool. There is irregular and ill-defined wall thickening involving the upper rectum near the rectosigmoid junction suspicious for infiltrating and likely constricting tumor. There is also significant perirectal interstitial change and adenopathy. No enlarged retroperitoneal  lymph nodes and no findings for omental or peritoneal surface disease. No inguinal adenopathy.  The bony structures are unremarkable.  IMPRESSION: Infiltrating rectal cancer with perirectal adenopathy, diffuse hepatic metastatic disease and pulmonary metastatic disease.   Electronically Signed   By: Kalman Jewels M.D.   On: 01/30/2014 12:46   Ir US Guide Bx Asp/drain  01/30/2014   CLINICAL DATA:  Numerous liver lesions compatible with metastases, rectal mass, presenting metastatic colorectal cancer  EXAM: ULTRASOUND GUIDED CORE BIOPSY OF LEFT HEPATIC MASS  MEDICATIONS: 3 mg IV Versed; 100 mcg IV Fentanyl  Total Moderate Sedation Time: 15 MIN  PROCEDURE: The procedure, risks, benefits, and alternatives were explained to the patient. Questions regarding the procedure were encouraged and answered. The patient understands and consents to the procedure.  The abdominal midline above the umbilicus was prepped with ChloraPrep in a sterile fashion, and a sterile drape was applied covering the operative field. A sterile gown and sterile gloves were used for the procedure. Local anesthesia was provided with 1% Lidocaine.  Previous imaging reviewed. Preliminary ultrasound performed. Several solid large echogenic masses demonstrated throughout the liver correlating with the CT. An anterior midline approach is noted into a left hepatic lesion traversing normal tissue. Under sterile conditions and local anesthesia, a 17 gauge 6.8 cm access needle was advanced from an anterior midline window into a left hepatic lobe echogenic mass. Needle position confirmed with ultrasound. 18 gauge core biopsies obtained. These were placed in formalin. Needle tract embolized with Gel-Foam. Postprocedure imaging demonstrates no hemorrhage or hematoma. Patient tolerated the biopsy well.  COMPLICATIONS: No immediate  FINDINGS: Imaging confirms needle placement in a large left hepatic echogenic mass  IMPRESSION: Successful ultrasound large left  hepatic mass 18 gauge core biopsy   Electronically Signed   By: Daryll Brod M.D.   On: 01/30/2014 16:02   Dg Abd Acute W/chest  02/05/2014   CLINICAL DATA:  Abdominal pain and distension  EXAM: ACUTE ABDOMEN SERIES (ABDOMEN 2 VIEW & CHEST 1 VIEW)  COMPARISON:  Prior study from 01/31/2014  FINDINGS: The cardiac and mediastinal silhouettes are stable in size and contour, and remain within normal limits.  The lungs are normally inflated. No airspace consolidation, pleural effusion, or pulmonary edema is identified. There is no pneumothorax.  Scattered loops of gas-filled nondilated bowel are seen within the lower abdomen and pelvis. No evidence of obstruction or ileus. No free intraperitoneal air. No soft tissue mass or abnormal calcification.  No acute osseous abnormality identified.  IMPRESSION: 1. Nonobstructive bowel gas pattern with no radiographic evidence of acute intra-abdominal process. 2. No acute cardiopulmonary process identified.   Electronically Signed   By: Jeannine Boga M.D.   On: 02/05/2014 22:06    Microbiology: Recent Results (from the past 240 hour(s))  URINE CULTURE     Status: None   Collection Time    01/31/14  1:36 PM      Result Value Ref Range Status   Specimen Description URINE, CLEAN CATCH   Final   Special Requests NONE   Final   Culture  Setup Time     Final   Value: 01/31/2014 21:44     Performed at [facility]     Final   Value: 3,000 COLONIES/ML     Performed at [facility]   Culture     Final   Value: INSIGNIFICANT GROWTH     Performed at [facility]   Report Status 02/01/2014 FINAL   Final     Labs: Basic Metabolic Panel:  Recent Labs Lab 02/05/14 0005 02/05/14 1751 02/06/14 0445 02/08/14 1105  NA 137 143 140 141  K 4.0 4.1 4.0 3.9  CL 97 103 101 99  CO2 24 23 24 22   GLUCOSE 96 100* 96 90  BUN 19 18 14 15   CREATININE 1.23 1.19 1.09 0.89  CALCIUM 9.5 9.3 8.9 9.3   Liver Function  Tests:  Recent Labs Lab 02/05/14 0005 02/05/14 1751 02/06/14 0445 02/08/14 1105  AST 95* 99* 104* 114*  ALT 94* 87* 85* 85*  ALKPHOS 292* 269* 271* 294*  BILITOT 1.4* 1.3* 1.4* 1.5*  PROT 7.6 7.2 7.0 7.4  ALBUMIN 3.1* 3.0* 2.9* 3.2*    Recent Labs Lab 02/05/14 0005 02/05/14 2140  LIPASE 34 32   No results found for this basename: AMMONIA,  in the last 168 hours CBC:  Recent Labs Lab 02/05/14 0005 02/05/14 1751 02/06/14 0130 02/06/14 0445 02/06/14 1630 02/08/14 1105  WBC 6.1 5.7 6.1 5.4  5.5 6.6 7.7  NEUTROABS 4.5 4.1  --   --   --   --   HGB 8.8* 8.7* 9.0* 8.5*  8.5* 8.4* 8.9*  HCT 26.6* 26.0* 27.4* 25.7*  25.2* 25.8* 27.1*  MCV 81.3 81.5 82.0 82.1  81.8 82.4 82.4  PLT 322 289 289 285  283 254 269   Cardiac Enzymes:  Recent Labs Lab 02/05/14 2140  TROPONINI <0.30   BNP: BNP (last 3 results) No results found for this basename: PROBNP,  in the last 8760 hours CBG:  Recent Labs Lab 02/06/14 0744 02/06/14 1140 02/06/14 1552 02/06/14 2037 02/06/14 2333  GLUCAP 100* 129* 110* 106* 114*       Signed:  Sendil K Krishnan  [facility] 02/08/2014, 5:28 PM

## 2014-02-09 NOTE — Progress Notes (Signed)
Pt d/c'd at this time. Pt given copy of instructions and script, pt informed and pointed out on his instructions the other scripts that had been sent to his pharmacy. Pt leaving by cab at pt request, pt assisted by giving number for cab of his choice, pt called for cab and pt was escorted by unit staff down to lobby for cab pick up. (pt refused wheelchair). Pt going to half way house he states then they are arranging for him to be transported back to prison where he can begin his chemotherapy process. He states since he is still under their care he cannot start the chemotherapy process if he is at the halfway house, so he must go back to prison to get all started now.   (pt wanted pain meds prior to leaving because he knows it will take a while to get meds picked up or started at the prison, pt had a steady gait as he walked with the NT to the lobby to catch the cab.)

## 2014-04-29 DEATH — deceased

## 2016-02-03 IMAGING — CR DG CHEST 2V
2 series · 2 of 2 positions shown · non-contrast
Comparison: 01/30/2014

CLINICAL DATA: Hypertension, history of rectal carcinoma

EXAM:
CHEST  2 VIEW

[w chest pa]
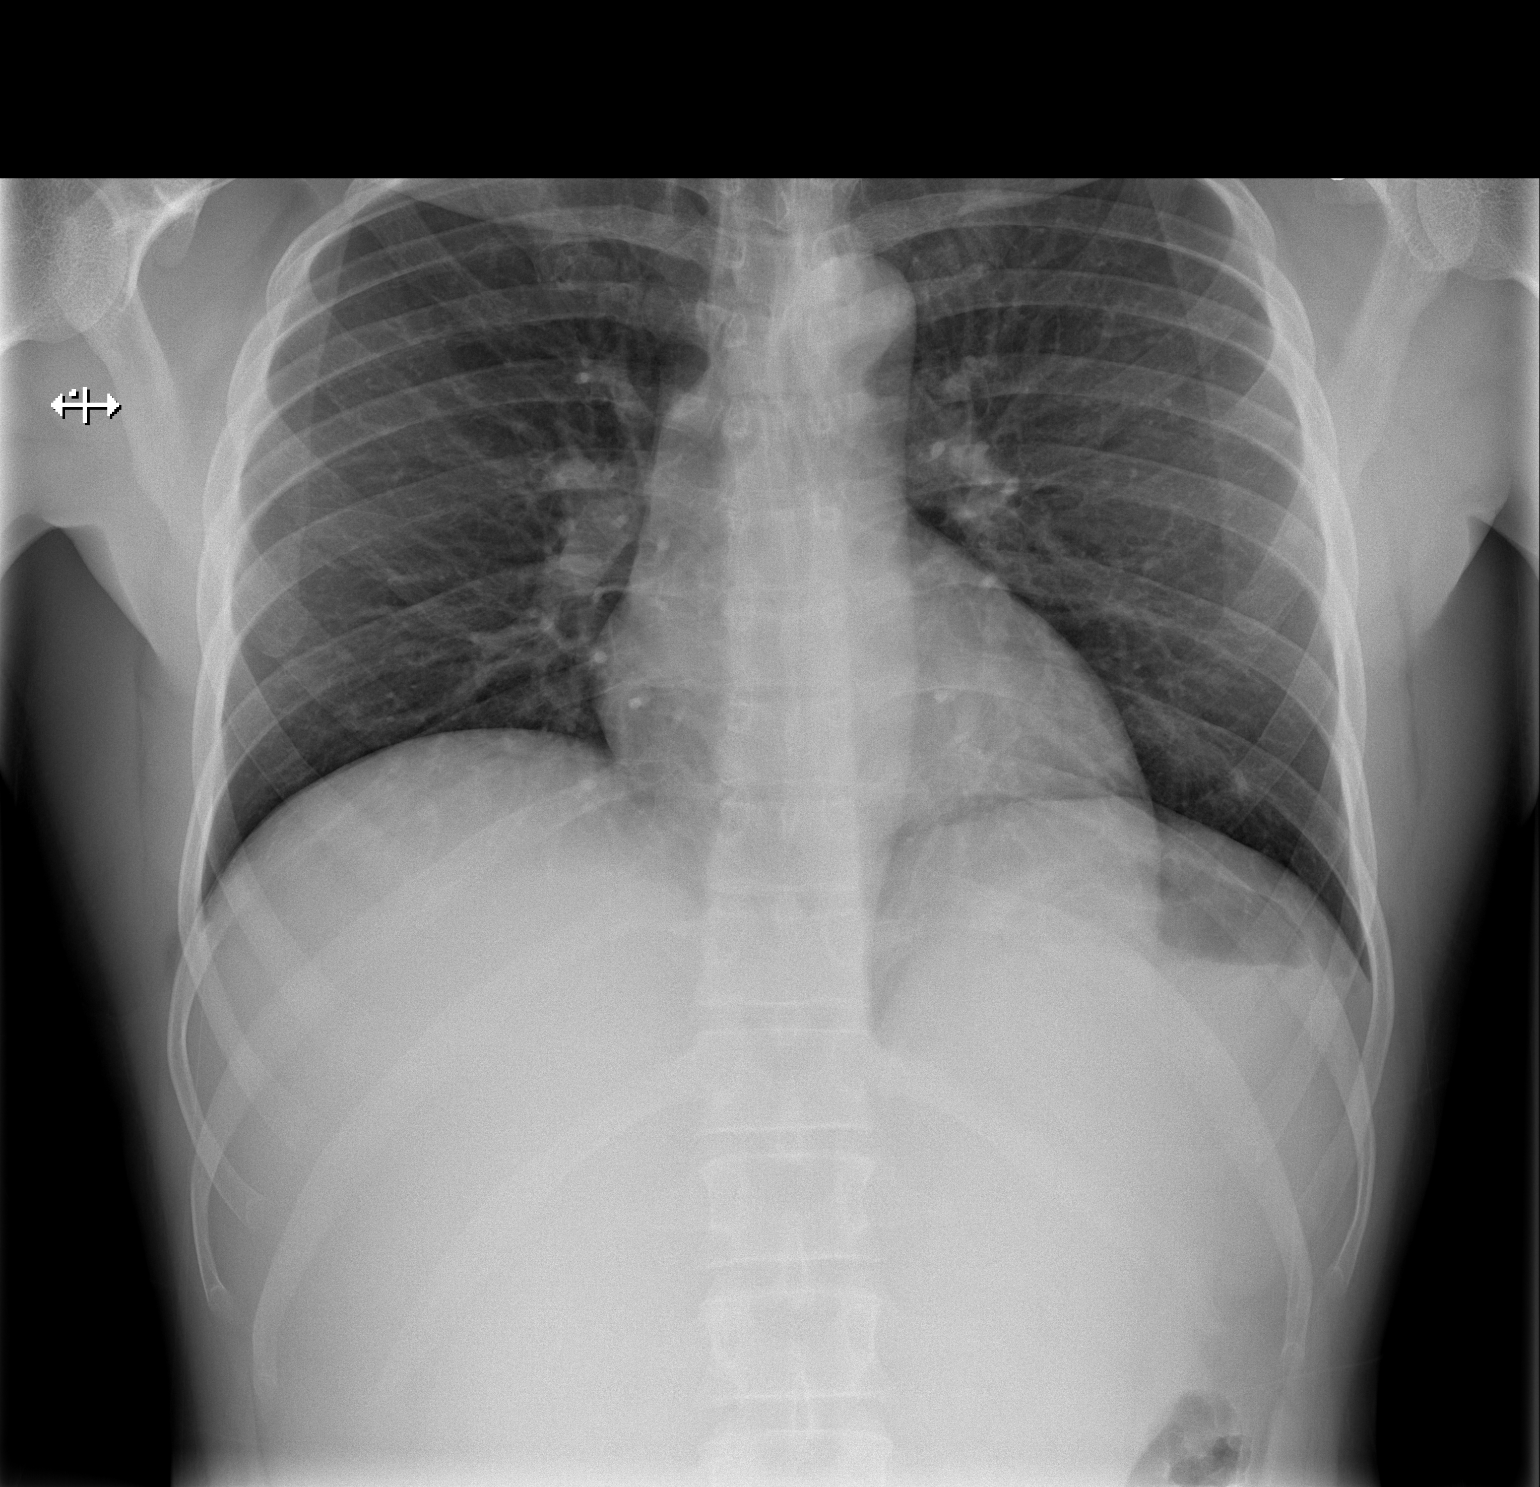

[w chest lat]
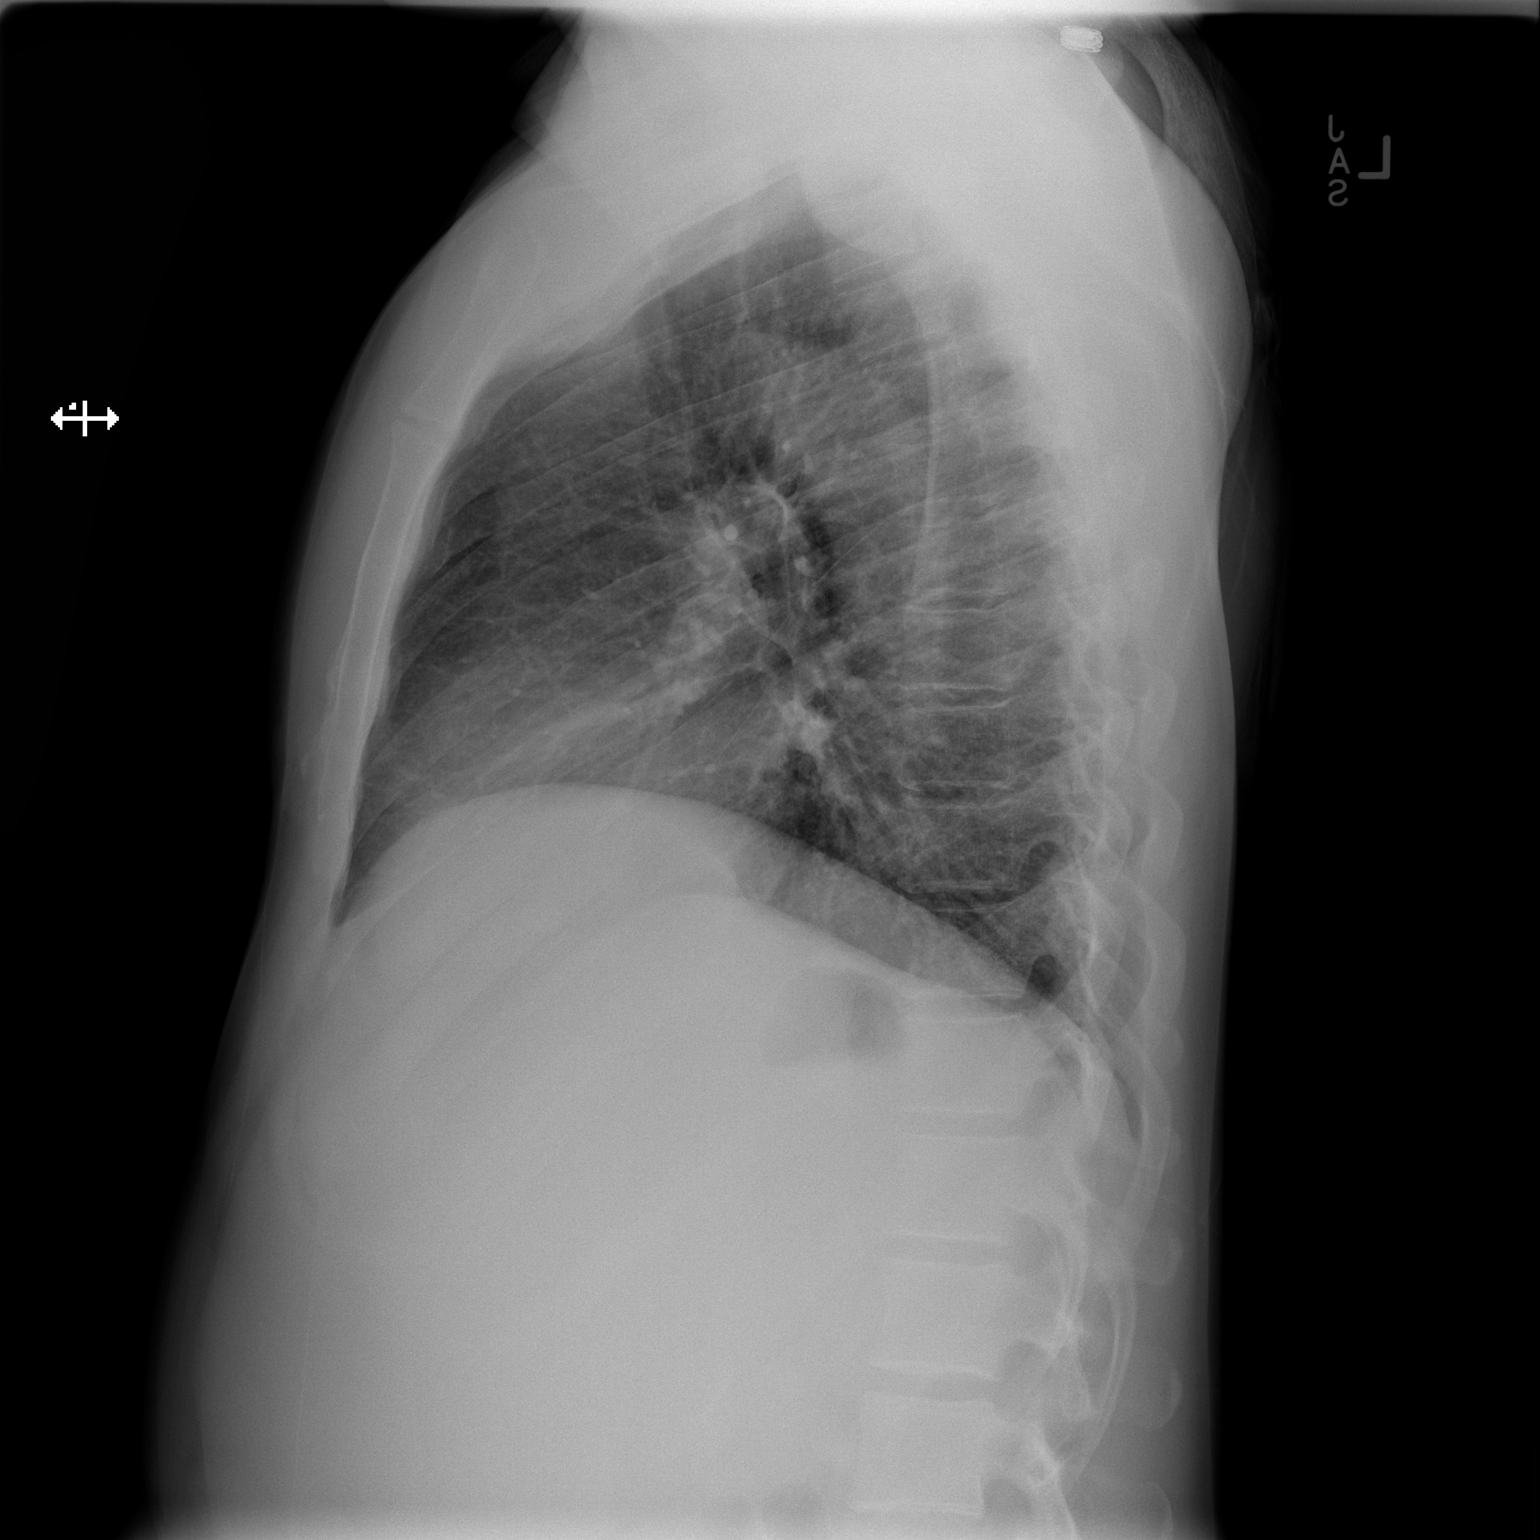

[2 of 2 positions shown; findings below may reference images not displayed]

FINDINGS: Cardiac shadow is within normal limits. The lungs are well aerated
bilaterally. The patient's known no pulmonary nodules are not well
appreciated on this exam due to a size. No bony abnormality is seen.
IMPRESSION: No active cardiopulmonary disease.

## 2016-02-09 IMAGING — CT CT ABD-PELV W/ CM
2 of 4 series · 17 of 46 positions shown, 19 images · IV contrast (CONTRAST)
Comparison: 01/30/2014

CLINICAL DATA: Abdominal pain. Recent diagnosis of metastatic
rectal carcinoma.

EXAM:
CT ABDOMEN AND PELVIS WITH CONTRAST
TECHNIQUE: Multidetector CT imaging of the abdomen and pelvis was performed
using the standard protocol following bolus administration of
intravenous contrast.
CONTRAST:  100mL OMNIPAQUE IOHEXOL 300 MG/ML  SOLN

[Series 2: routine · axial · 0.68mm/px · z∈[+52,+517]mm · 14 of 103 slices shown, 16 images]
[im 5/103  soft-tissue]
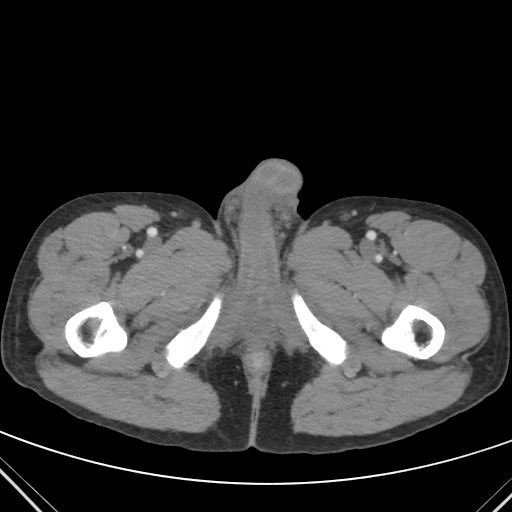
[im 5/103  bone]
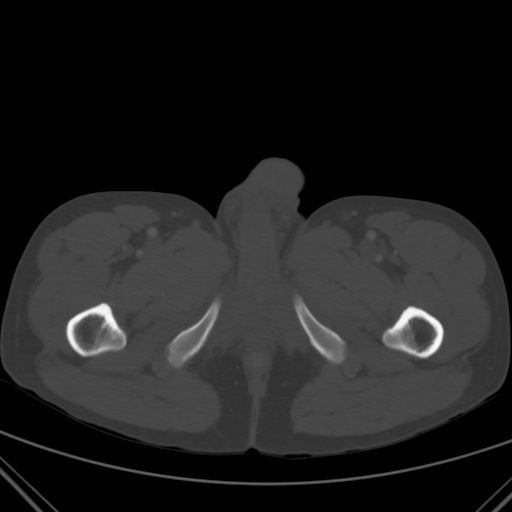
[im 13/103  soft-tissue]
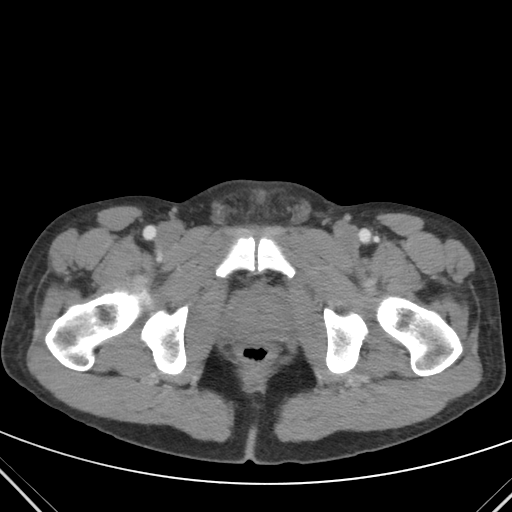
[im 21/103  soft-tissue]
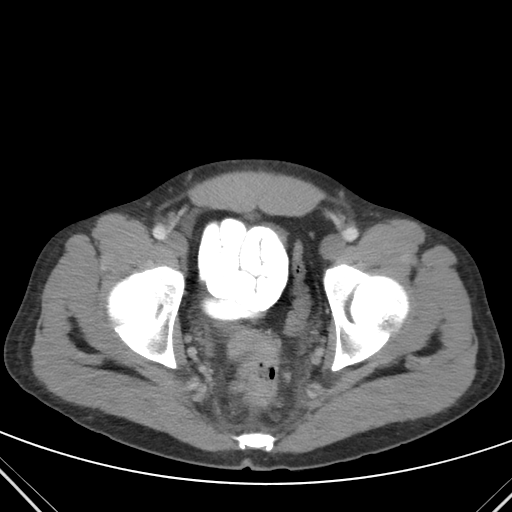
[im 29/103  soft-tissue]
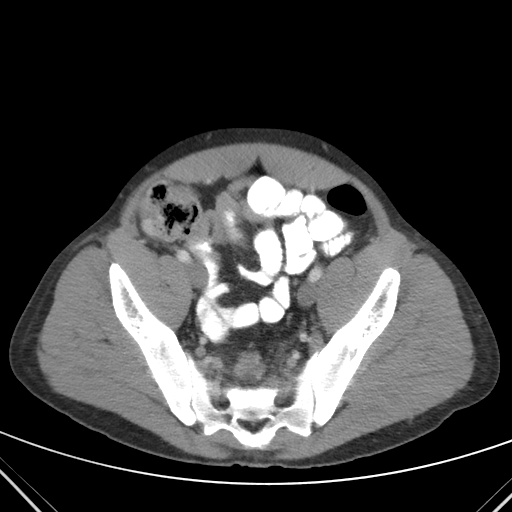
[im 33/103  soft-tissue]
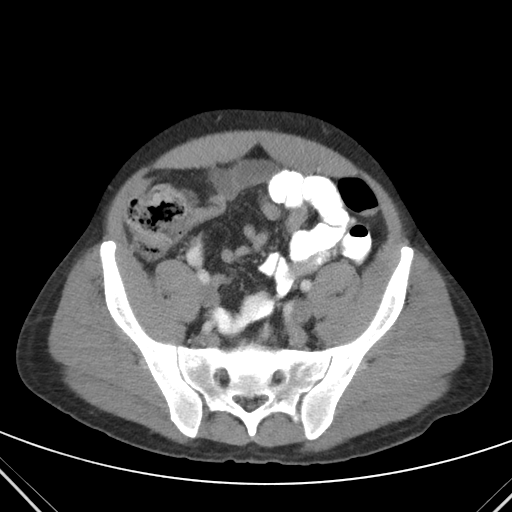
[im 41/103  soft-tissue]
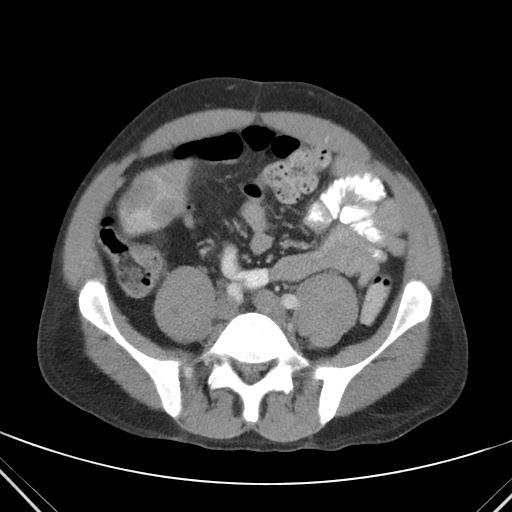
[im 49/103  soft-tissue]
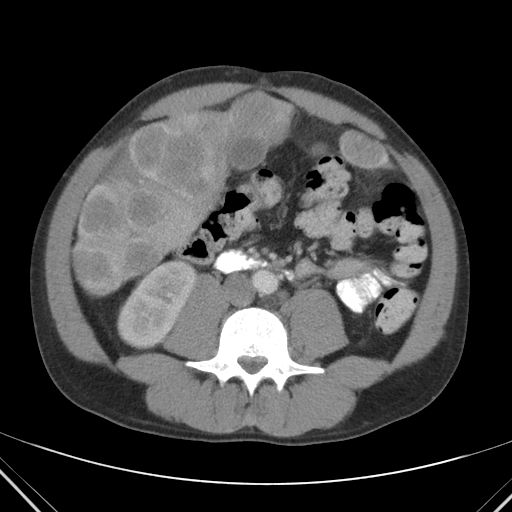
[im 54/103  soft-tissue]
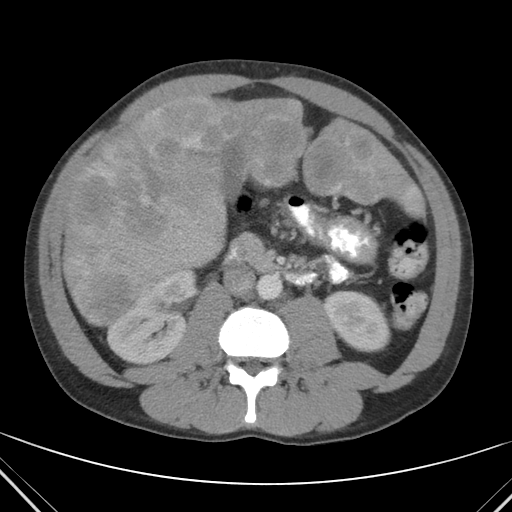
[im 62/103  soft-tissue]
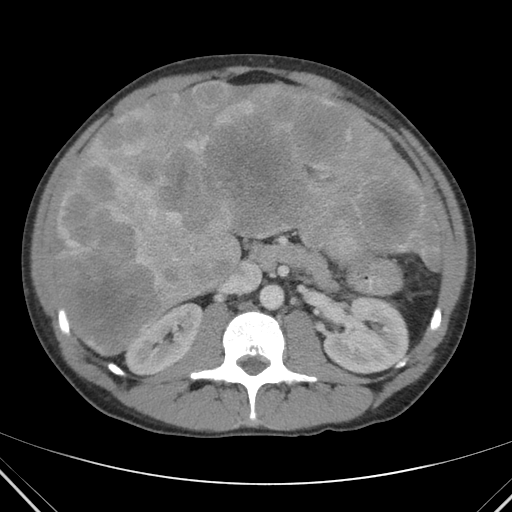
[im 62/103  bone]
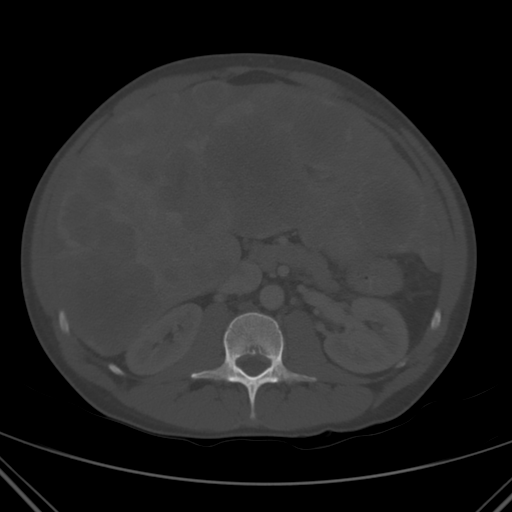
[im 70/103  soft-tissue]
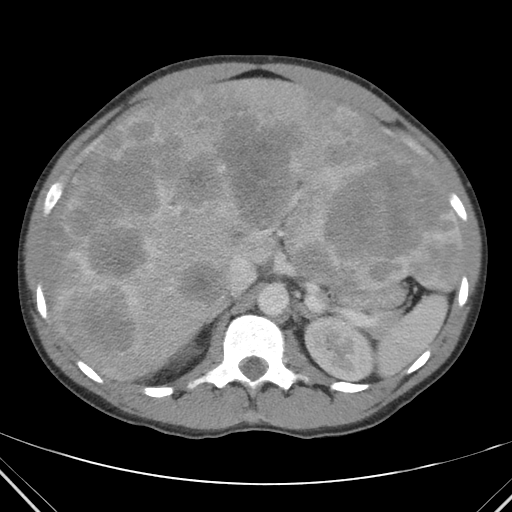
[im 78/103  soft-tissue]
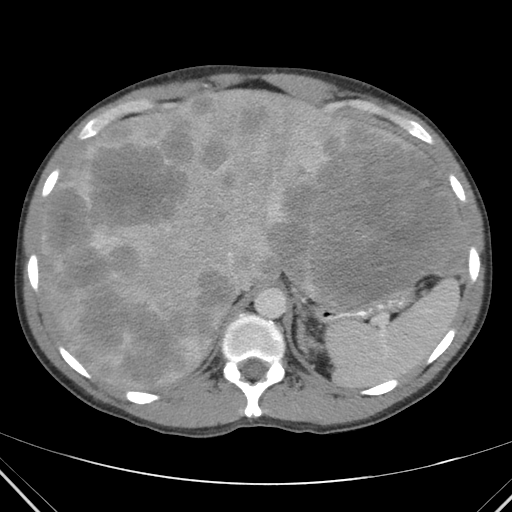
[im 82/103  soft-tissue]
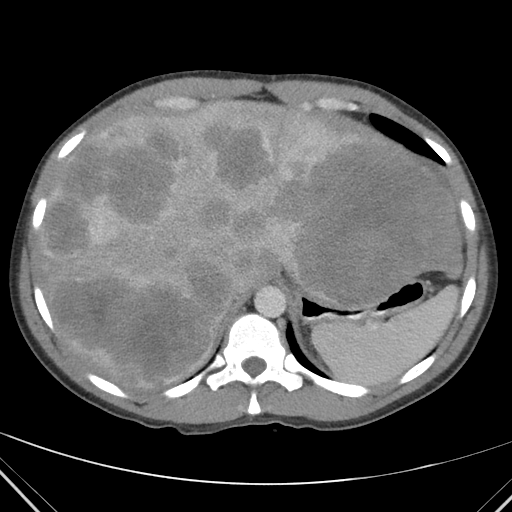
[im 90/103  soft-tissue]
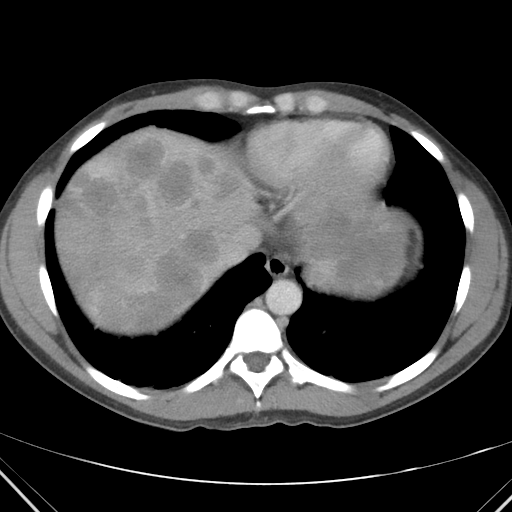
[im 98/103  soft-tissue]
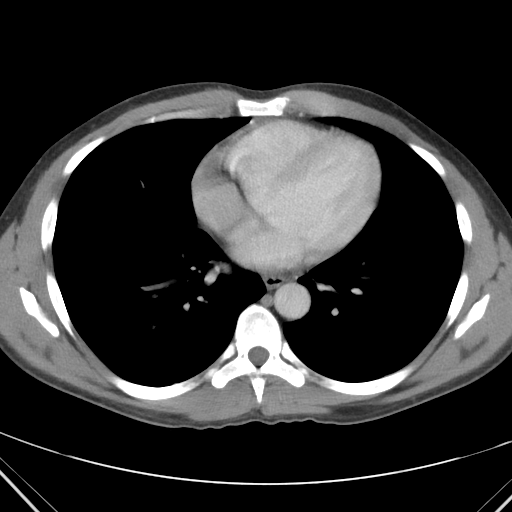

[mpr, coronals, coronal · coronal · 1.00mm/px · 3 of 108 slices shown]
[im 36/108  soft-tissue]
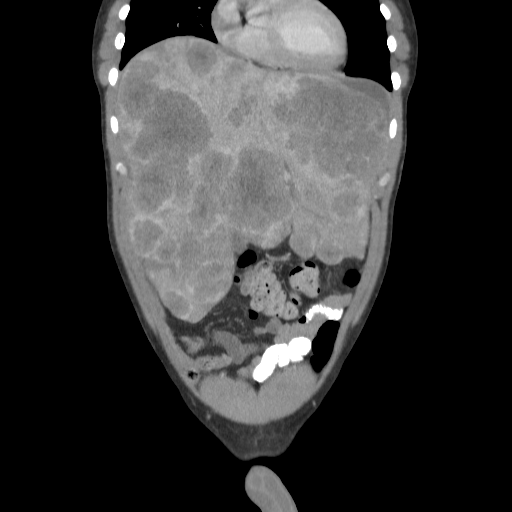
[im 48/108  soft-tissue]
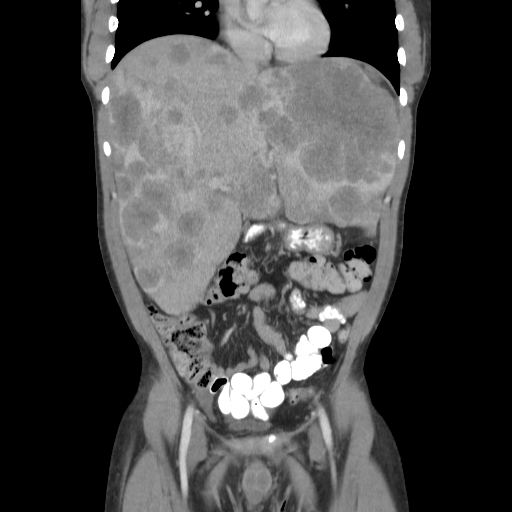
[im 60/108  soft-tissue]
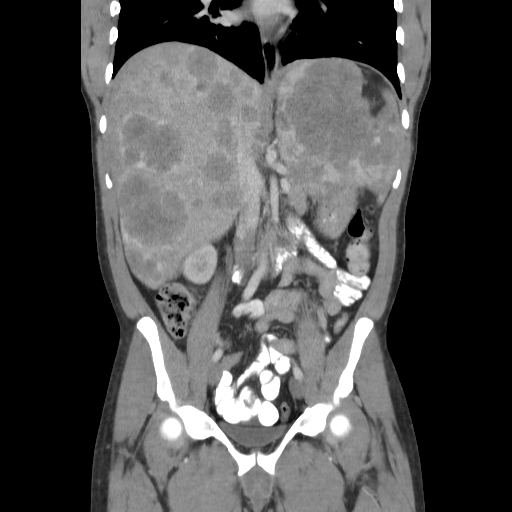

[17 of 46 positions shown; findings below may reference images not displayed]

FINDINGS: Lung bases demonstrate numerous small bilateral pulmonary nodules
unchanged likely metastatic disease.

Abdominal images demonstrate numerous peripherally enhancing
hypodense masses throughout the liver without significant change
compatible with metastatic disease. The spleen, pancreas,
gallbladder and adrenal glands are within normal. Kidneys are normal
in size without hydronephrosis or nephrolithiasis. The appendix is
normal. There is mild fecal retention throughout the colon. The low
density 1 cm lymph node posterior to the aortic bifurcation
unchanged.

Pelvic images again demonstrate focal masslike wall thickening over
the rectum without significant change likely representing patient's
known neoplasm. There are small adjacent perirectal and right pelvic
sidewall lymph nodes unchanged. There is no evidence of perforation.
There is a normal amount of fluid in the presacral space and right
anterior pelvis. Remaining bony structures are within normal.
IMPRESSION: No acute findings in the abdomen/pelvis.

Mass like wall thickening over the rectum unchanged likely
representing patient's known rectal carcinoma. Small adjacent
perirectal and right pelvic sidewall lymph nodes unchanged. Severe
metastatic involvement of the liver as well as pulmonary metastatic
disease unchanged. 1 cm low-density lymph node posterior to the
aortic bifurcation unchanged.

Minimal free fluid over the presacral space and anterior right
pelvis.
